# Patient Record
Sex: Female | Born: 1945 | Race: White | Hispanic: No | Marital: Married | Smoking: Former smoker
Health system: Southern US, Community
[De-identification: ages and names within clinical notes are randomized; demographics above are authoritative.]

## PROBLEM LIST (undated history)

## (undated) DIAGNOSIS — F419 Anxiety disorder, unspecified: Secondary | ICD-10-CM

## (undated) DIAGNOSIS — I10 Essential (primary) hypertension: Secondary | ICD-10-CM

## (undated) DIAGNOSIS — K219 Gastro-esophageal reflux disease without esophagitis: Secondary | ICD-10-CM

## (undated) DIAGNOSIS — E039 Hypothyroidism, unspecified: Secondary | ICD-10-CM

## (undated) DIAGNOSIS — I2510 Atherosclerotic heart disease of native coronary artery without angina pectoris: Secondary | ICD-10-CM

## (undated) HISTORY — PX: HERNIA REPAIR: SHX51

## (undated) HISTORY — DX: Hypothyroidism, unspecified: E03.9

---

## 1999-02-04 ENCOUNTER — Emergency Department (HOSPITAL_COMMUNITY): Admission: EM | Admit: 1999-02-04 | Discharge: 1999-02-04 | Payer: Self-pay | Admitting: *Deleted

## 1999-02-06 ENCOUNTER — Emergency Department (HOSPITAL_COMMUNITY): Admission: EM | Admit: 1999-02-06 | Discharge: 1999-02-06 | Payer: Self-pay | Admitting: Emergency Medicine

## 1999-02-18 ENCOUNTER — Emergency Department (HOSPITAL_COMMUNITY): Admission: EM | Admit: 1999-02-18 | Discharge: 1999-02-18 | Payer: Self-pay | Admitting: Emergency Medicine

## 1999-05-09 ENCOUNTER — Other Ambulatory Visit: Admission: RE | Admit: 1999-05-09 | Discharge: 1999-05-09 | Payer: Self-pay | Admitting: *Deleted

## 2001-01-07 ENCOUNTER — Other Ambulatory Visit: Admission: RE | Admit: 2001-01-07 | Discharge: 2001-01-07 | Payer: Self-pay | Admitting: *Deleted

## 2002-06-26 ENCOUNTER — Ambulatory Visit (HOSPITAL_COMMUNITY): Admission: RE | Admit: 2002-06-26 | Discharge: 2002-06-26 | Payer: Self-pay | Admitting: Gastroenterology

## 2002-11-24 ENCOUNTER — Other Ambulatory Visit: Admission: RE | Admit: 2002-11-24 | Discharge: 2002-11-24 | Payer: Self-pay | Admitting: *Deleted

## 2003-04-07 ENCOUNTER — Encounter: Admission: RE | Admit: 2003-04-07 | Discharge: 2003-04-07 | Payer: Self-pay | Admitting: Internal Medicine

## 2003-07-10 ENCOUNTER — Observation Stay (HOSPITAL_COMMUNITY): Admission: RE | Admit: 2003-07-10 | Discharge: 2003-07-11 | Payer: Self-pay | Admitting: Surgery

## 2003-07-10 ENCOUNTER — Encounter (INDEPENDENT_AMBULATORY_CARE_PROVIDER_SITE_OTHER): Payer: Self-pay | Admitting: Specialist

## 2003-09-04 ENCOUNTER — Encounter: Admission: RE | Admit: 2003-09-04 | Discharge: 2003-09-04 | Payer: Self-pay | Admitting: Gastroenterology

## 2003-10-26 ENCOUNTER — Encounter (INDEPENDENT_AMBULATORY_CARE_PROVIDER_SITE_OTHER): Payer: Self-pay | Admitting: Specialist

## 2003-10-27 ENCOUNTER — Inpatient Hospital Stay (HOSPITAL_COMMUNITY): Admission: RE | Admit: 2003-10-27 | Discharge: 2003-10-28 | Payer: Self-pay | Admitting: Surgery

## 2004-07-20 ENCOUNTER — Encounter: Admission: RE | Admit: 2004-07-20 | Discharge: 2004-07-20 | Payer: Self-pay | Admitting: Surgery

## 2006-07-09 ENCOUNTER — Other Ambulatory Visit: Admission: RE | Admit: 2006-07-09 | Discharge: 2006-07-09 | Payer: Self-pay | Admitting: Obstetrics & Gynecology

## 2007-07-24 ENCOUNTER — Other Ambulatory Visit: Admission: RE | Admit: 2007-07-24 | Discharge: 2007-07-24 | Payer: Self-pay | Admitting: Obstetrics and Gynecology

## 2007-08-01 ENCOUNTER — Encounter: Admission: RE | Admit: 2007-08-01 | Discharge: 2007-08-01 | Payer: Self-pay | Admitting: Internal Medicine

## 2007-08-09 ENCOUNTER — Encounter: Admission: RE | Admit: 2007-08-09 | Discharge: 2007-08-09 | Payer: Self-pay | Admitting: Gastroenterology

## 2010-03-20 ENCOUNTER — Encounter: Payer: Self-pay | Admitting: Gastroenterology

## 2010-07-15 NOTE — Op Note (Signed)
NAME:  MONCERRATH, BERHE                           ACCOUNT NO.:  [id number]   MEDICAL RECORD NO.:  [id number]                   PATIENT TYPE:  OBV   LOCATION:  0098                                 FACILITY:  [facility]   PHYSICIAN:  Sandria Bales. Ezzard Standing, M.D.               DATE OF BIRTH:  December 24, 1945   DATE OF PROCEDURE:  10/26/2003  DATE OF DISCHARGE:                                 OPERATIVE REPORT   PREOPERATIVE DIAGNOSIS:  Medium-sized paraesophageal hiatal hernia.   POSTOPERATIVE DIAGNOSIS:  Medium-sized paraesophageal hiatal hernia.   PROCEDURE:  Repair of hiatal hernia with Nissen fundoplication.   SURGEON:  Sandria Bales. Ezzard Standing, M.D.   FIRST ASSISTANT:  Thornton Park. Daphine Deutscher, M.D.   ANESTHESIA:  General endotracheal.   ESTIMATED BLOOD LOSS:  Minimal.   INDICATION FOR PROCEDURE:  Ms. Faraj is a 65 year old white female who has  had recurrent epigastric abdominal pain with eating, and upper GIs revealed  a medium to large-sized paraesophageal hiatal hernia.  Discussion was  carried out with the patient about the options for taking care of this.  She  has tried proton pump inhibitors without success and now comes for attempted  laparoscopic repair of this hernia.   The patient placed in a supine position with her arms tucked to her sides.  Her abdomen was prepped with Betadine solution and sterilely draped.  She  was given 1 g of Ancef at the initiation of the procedure.  She had PAS  stockings in place.  She was then placed in a reverse Trendelenburg position  and her abdomen was draped and sterilely dressed.   I got into the abdomen using an Optiview through a left subcostal incision.  I placed four additional trocars, a left paramedian and a right paramedian  trocar, a right subcostal trocar, and then a 5 mm subxiphoid trocar.   Abdominal exploration revealed the right and left lobes of the liver were  unremarkable.  The bowel was otherwise unremarkable.  The gallbladder was  absent.  I  did then reduce the stomach out of the hiatal hernia.  Maybe  about a third to half the stomach was within the hiatal hernia.  The hernia  was based primarily anterior and to the left of the gastroesophageal  junction.   I made an incision anteriorly along the edge of the hernia and then carried  this down to the gastroesophageal junction on the left side.  I went behind  the esophagus and identified the right crus posteriorly to where it joins  the left crus, then went up the left crus behind the esophagus.  I then went  down and took down the short gastrics, taking down maybe 8-10 cm of the  stomach to try to make where I had a floppy cardia of the stomach.   At this point I stripped out the remainder of the hernia sac.  I divided the  sac and sent it to pathology.  I then closed the hernia posteriorly.  I  placed three interrupted 0 Surgilon sutures, closing the crus posteriorly.  I placed a single along the anterior.  This left the esophagus coming  through the GE junction with a little bit of extra room.   I then passed the stomach behind the esophagus.  I passed a lighted 56  bougie and did my wrap over the 56 bougie.  My first stitch caught both the  stomach to the left, the esophagus anteriorly, and the stomach to the right  side and tied this down.  My next sutures each at about 1.5 cm in spacing.  I did not try to catch any of the esophagus or the stomach.  Really there  was just a lot of fat and preperitoneal kind of hernia sac tissue.   After the three sutures I then measured the wrap.  It was about 3.5 cm long.  I used Surgidac on each of the sutures with a wrap of 0 Surgidac.  I marked  each with a clip for future reference.  I then withdrew the lighted bougie.  This left a floppy Nissen.  The hernia hole itself had been repaired and  closed with what I thought was adequate room for the esophagus.  There was  no evidence of any injury to the esophagus or the stomach.    The trocars were then removed in turn.  I had used a Surveyor, mining for  the liver, and this was removed.  The upper abdomen was then evaluated and  again the liver again looked okay after it had been returned to a normal  location.  Each trocar was removed in turn.  The skin at each trocar site  was closed with a 5-0 Vicryl suture, painted with tincture of Benzoin and  steri-stripped.   The patient tolerated the procedure well.  Again, stripping out and  identifying the hernia sac, stripping the hernia sac out, closing the hernia  defect at the crus, and the wrap all went according to plan with no surprise  or difficulties.                                               Sandria Bales. Ezzard Standing, M.D.    DHN/MEDQ  D:  10/26/2003  T:  10/27/2003  Job:  [id number]   cc:   Theressa Millard, M.D.  [address] [place]  [place] [address]  Fax: [phone]   Tasia Catchings, M.D.  [address]  [place]  [place] [address]  Fax: [phone]

## 2010-07-15 NOTE — Op Note (Signed)
NAME:  Meagan Stout, Meagan Stout                           ACCOUNT NO.:  [id number]   MEDICAL RECORD NO.:  [id number]                   PATIENT TYPE:  AMB   LOCATION:  DAY                                  FACILITY:  [facility]   PHYSICIAN:  Sandria Bales. Ezzard Standing, M.D.               DATE OF BIRTH:  05-May-1945   DATE OF PROCEDURE:  07/10/2003  DATE OF DISCHARGE:                                 OPERATIVE REPORT   PREOPERATIVE DIAGNOSES:  Chronic cholecystitis with cholelithiasis.   POSTOPERATIVE DIAGNOSES:  Chronic cholecystitis with cholelithiasis.   PROCEDURE:  Laparoscopic cholecystectomy (I did not do a cholangiogram).   SURGEON:  Sandria Bales. Ezzard Standing, M.D.   FIRST ASSISTANT:  Lebron Conners, M.D.   ANESTHESIA:  General endotracheal.   ESTIMATED BLOOD LOSS:  Minimal.   INDICATIONS FOR PROCEDURE:  Ms. Meagan Stout is a 65 year old white female who has  had vague GI symptoms for some time. She underwent an ultrasound on the 8th  of February 2005, which showed a ___________ gallbladder which was felt to  be consistent with gallbladder contraction and probable shattering of  gallstones.   The patient comes to the operating room for attempted laparoscopic  cholecystectomy. The indications and potential complications were explained  to the patient. The potential complications included but not limited to  bleeding, infection, bile duct injury, open surgery.   The patient was placed in the supine position, given 1 gram of Ancef at the  initiation of the procedure.  PAS stockings were placed, she underwent a  general anesthesia supervised by Dr. Lucious Groves. She has kind of a smiling  infraumbilical incision and I went through it to get to the abdominal  cavity.  I used a 12 mm Hasson trocar to get into the abdominal cavity.   This was secured with a #0 Vicryl suture and I used a zero degree 10 mm  laparoscope. The right and left lobes of the liver were unremarkable, the  anterior wall of the stomach was  unremarkable. The remainder of her bowel  was pretty much covered with omentum. Four additional ports were placed, a  10 mm Ethicon trocar in the subxiphoid location, a 5 mm Ethicon trocar in  the right mid subcostal and a 5 mm Ethicon trocar in the right lateral  location. Getting up under the liver, the gallbladder was noted to be  extremely tiny. This gallbladder was probably not much more then 2 cm in  length and I had to put a fifth trocar which was a 5 mm trocar sort of  midway between the umbilicus and the xiphoid to kind of have a second hand  to work with while Dr. Orson Slick who is my assistant could hold up the liver  and expose the liver and gallbladder for me.   I first dissected out the cystic duct gallbladder junction. I tried to shoot  a cholangiogram but she had a  tiny cystic duct.  I could use it to cut off a  taut catheter inserted through a 14 gauge Jelco with multiple attempts at  trying to get the catheter into it were unsuccessful. I then recut the  cystic duct a little bit further down and again she had such a tiny cystic  duct, I really was never able to cannulate it so I did not try to do a  cholangiogram but I did think I had clear anatomy where the cystic duct went  to the gallbladder there was no other obvious side duct or abnormality. She  did have some gravel float out of the cystic duct and her gallbladder seemed  to be packed full of stones.   So I then placed three clips across the cystic duct. I clipped the cystic  artery twice and then dissected out the gallbladder using hook Bovie  coagulation.   The gallbladder was completely divided from the gallbladder bed, I  reexamined the gallbladder bed which was really up under the liver and deep  in the triangle of Calot and this was unremarkable for any bleeding or bile  leak. I then delivered the gallbladder through the umbilicus intact again  this gallbladder was probably 2 cm in length at the most and did  have a  bunch of stains in it.   I then irrigated the abdomen with about 500 mL of saline, removed the trocar  and there was no bleeding in the trocar site. The umbilical port was closed  with a #0 Vicryl suture, the skin at each site was closed with 5-0 Vicryl  suture, painted with tincture of Benzoin and Steri-Strips.  The patient  tolerated the procedure well and was transported to the recovery room in  good condition.                                               Sandria Bales. Ezzard Standing, M.D.    DHN/MEDQ  D:  07/10/2003  T:  07/10/2003  Job:  [id number]

## 2016-02-07 ENCOUNTER — Other Ambulatory Visit: Payer: Self-pay | Admitting: Internal Medicine

## 2016-02-07 ENCOUNTER — Other Ambulatory Visit (HOSPITAL_COMMUNITY)
Admission: RE | Admit: 2016-02-07 | Discharge: 2016-02-07 | Disposition: A | Discharge status: Home / Self Care | Payer: Medicare Other | Source: Ambulatory Visit | Attending: Internal Medicine | Admitting: Internal Medicine

## 2016-02-07 DIAGNOSIS — Z1151 Encounter for screening for human papillomavirus (HPV): Secondary | ICD-10-CM | POA: Insufficient documentation

## 2016-02-07 DIAGNOSIS — Z01419 Encounter for gynecological examination (general) (routine) without abnormal findings: Secondary | ICD-10-CM | POA: Diagnosis present

## 2016-02-10 LAB — CYTOLOGY - PAP
Diagnosis: NEGATIVE
HPV: NOT DETECTED

## 2017-08-27 ENCOUNTER — Other Ambulatory Visit: Payer: Self-pay | Admitting: Orthopedic Surgery

## 2017-08-27 DIAGNOSIS — M25562 Pain in left knee: Secondary | ICD-10-CM

## 2017-08-29 ENCOUNTER — Ambulatory Visit
Admission: RE | Admit: 2017-08-29 | Discharge: 2017-08-29 | Disposition: A | Discharge status: Home / Self Care | Payer: Medicare Other | Source: Ambulatory Visit | Attending: Orthopedic Surgery | Admitting: Orthopedic Surgery

## 2017-08-29 DIAGNOSIS — M25562 Pain in left knee: Secondary | ICD-10-CM

## 2017-09-07 DIAGNOSIS — M2392 Unspecified internal derangement of left knee: Secondary | ICD-10-CM | POA: Insufficient documentation

## 2018-04-11 ENCOUNTER — Inpatient Hospital Stay (HOSPITAL_COMMUNITY)
Admission: EM | Admit: 2018-04-11 | Discharge: 2018-04-12 | DRG: 286 | Disposition: A | Discharge status: Home / Self Care | Payer: Medicare Other | Attending: Internal Medicine | Admitting: Internal Medicine

## 2018-04-11 ENCOUNTER — Encounter: Payer: Self-pay | Admitting: Emergency Medicine

## 2018-04-11 ENCOUNTER — Other Ambulatory Visit: Payer: Self-pay

## 2018-04-11 ENCOUNTER — Emergency Department (HOSPITAL_COMMUNITY): Payer: Medicare Other

## 2018-04-11 DIAGNOSIS — K219 Gastro-esophageal reflux disease without esophagitis: Secondary | ICD-10-CM | POA: Diagnosis present

## 2018-04-11 DIAGNOSIS — I5041 Acute combined systolic (congestive) and diastolic (congestive) heart failure: Secondary | ICD-10-CM | POA: Diagnosis not present

## 2018-04-11 DIAGNOSIS — I252 Old myocardial infarction: Secondary | ICD-10-CM | POA: Diagnosis not present

## 2018-04-11 DIAGNOSIS — I248 Other forms of acute ischemic heart disease: Secondary | ICD-10-CM | POA: Diagnosis not present

## 2018-04-11 DIAGNOSIS — I214 Non-ST elevation (NSTEMI) myocardial infarction: Secondary | ICD-10-CM | POA: Diagnosis present

## 2018-04-11 DIAGNOSIS — I2510 Atherosclerotic heart disease of native coronary artery without angina pectoris: Principal | ICD-10-CM | POA: Diagnosis present

## 2018-04-11 DIAGNOSIS — Z8249 Family history of ischemic heart disease and other diseases of the circulatory system: Secondary | ICD-10-CM

## 2018-04-11 DIAGNOSIS — Z87891 Personal history of nicotine dependence: Secondary | ICD-10-CM | POA: Diagnosis not present

## 2018-04-11 DIAGNOSIS — R079 Chest pain, unspecified: Secondary | ICD-10-CM | POA: Diagnosis not present

## 2018-04-11 DIAGNOSIS — I959 Hypotension, unspecified: Secondary | ICD-10-CM | POA: Diagnosis not present

## 2018-04-11 DIAGNOSIS — R7989 Other specified abnormal findings of blood chemistry: Secondary | ICD-10-CM

## 2018-04-11 DIAGNOSIS — I5031 Acute diastolic (congestive) heart failure: Secondary | ICD-10-CM | POA: Diagnosis present

## 2018-04-11 DIAGNOSIS — R778 Other specified abnormalities of plasma proteins: Secondary | ICD-10-CM

## 2018-04-11 DIAGNOSIS — F419 Anxiety disorder, unspecified: Secondary | ICD-10-CM | POA: Diagnosis not present

## 2018-04-11 DIAGNOSIS — I5042 Chronic combined systolic (congestive) and diastolic (congestive) heart failure: Secondary | ICD-10-CM | POA: Diagnosis present

## 2018-04-11 HISTORY — DX: Gastro-esophageal reflux disease without esophagitis: K21.9

## 2018-04-11 HISTORY — DX: Atherosclerotic heart disease of native coronary artery without angina pectoris: I25.10

## 2018-04-11 HISTORY — DX: Anxiety disorder, unspecified: F41.9

## 2018-04-11 LAB — BASIC METABOLIC PANEL
Anion gap: 6 (ref 5–15)
BUN: 10 mg/dL (ref 8–23)
CO2: 26 mmol/L (ref 22–32)
Calcium: 8.8 mg/dL — ABNORMAL LOW (ref 8.9–10.3)
Chloride: 109 mmol/L (ref 98–111)
Creatinine, Ser: 0.95 mg/dL (ref 0.44–1.00)
GFR calc Af Amer: >60 mL/min (ref >60)
GFR calc non Af Amer: 60 mL/min — ABNORMAL LOW (ref >60)
Glucose, Bld: 96 mg/dL (ref 70–99)
Potassium: 4 mmol/L (ref 3.5–5.1)
Sodium: 141 mmol/L (ref 135–145)

## 2018-04-11 LAB — CBC WITH DIFFERENTIAL/PLATELET
Abs Immature Granulocytes: 0.03 10*3/uL (ref 0.00–0.07)
Basophils Absolute: 0.1 10*3/uL (ref 0.0–0.1)
Basophils Relative: 1 %
Eosinophils Absolute: 0.1 10*3/uL (ref 0.0–0.5)
Eosinophils Relative: 1 %
HCT: 41.4 % (ref 36.0–46.0)
Hemoglobin: 13 g/dL (ref 12.0–15.0)
Immature Granulocytes: 0 %
Lymphocytes Relative: 21 %
Lymphs Abs: 2 10*3/uL (ref 0.7–4.0)
MCH: 26.9 pg (ref 26.0–34.0)
MCHC: 31.4 g/dL (ref 30.0–36.0)
MCV: 85.5 fL (ref 80.0–100.0)
Monocytes Absolute: 0.9 10*3/uL (ref 0.1–1.0)
Monocytes Relative: 9 %
Neutro Abs: 6.5 10*3/uL (ref 1.7–7.7)
Neutrophils Relative %: 68 %
Platelets: 280 10*3/uL (ref 150–400)
RBC: 4.84 MIL/uL (ref 3.87–5.11)
RDW: 13.3 % (ref 11.5–15.5)
WBC: 9.6 10*3/uL (ref 4.0–10.5)
nRBC: 0 % (ref 0.0–0.2)

## 2018-04-11 LAB — HEMOGLOBIN A1C
Hgb A1c MFr Bld: 6.1 % — ABNORMAL HIGH (ref 4.8–5.6)
Mean Plasma Glucose: 128.37 mg/dL

## 2018-04-11 LAB — BRAIN NATRIURETIC PEPTIDE: B Natriuretic Peptide: 740.5 pg/mL — ABNORMAL HIGH (ref 0.0–100.0)

## 2018-04-11 LAB — I-STAT TROPONIN, ED: Troponin i, poc: 0.51 ng/mL (ref 0.00–0.08)

## 2018-04-11 LAB — D-DIMER, QUANTITATIVE: D-Dimer, Quant: 0.37 ug/mL-FEU (ref 0.00–0.50)

## 2018-04-11 LAB — HEPARIN LEVEL (UNFRACTIONATED): Heparin Unfractionated: 0.19 IU/mL — ABNORMAL LOW (ref 0.30–0.70)

## 2018-04-11 LAB — TROPONIN I: Troponin I: 0.58 ng/mL (ref <0.03)

## 2018-04-11 MED ORDER — SODIUM CHLORIDE 0.9 % WEIGHT BASED INFUSION
3.0000 mL/kg/h | INTRAVENOUS | Status: DC
  Administered 2018-04-12: 3 mL/kg/h via INTRAVENOUS

## 2018-04-11 MED ORDER — ASPIRIN EC 81 MG PO TBEC: 81.0000 mg | DELAYED_RELEASE_TABLET | Freq: Every day | ORAL | Status: DC

## 2018-04-11 MED ORDER — ASPIRIN 81 MG PO CHEW
81.0000 mg | CHEWABLE_TABLET | ORAL | Status: AC
  Administered 2018-04-12: 81 mg via ORAL
  Filled 2018-04-11: qty 1

## 2018-04-11 MED ORDER — PAROXETINE HCL 20 MG PO TABS
20.0000 mg | ORAL_TABLET | Freq: Every day | ORAL | Status: DC
  Administered 2018-04-11: 20 mg via ORAL
  Filled 2018-04-11: qty 1

## 2018-04-11 MED ORDER — ONDANSETRON HCL 4 MG/2ML IJ SOLN: 4.0000 mg | Freq: Four times a day (QID) | INTRAMUSCULAR | Status: DC | PRN

## 2018-04-11 MED ORDER — CALCIUM CARBONATE ANTACID 500 MG PO CHEW: 1.0000 | CHEWABLE_TABLET | ORAL | Status: DC | PRN

## 2018-04-11 MED ORDER — LOPERAMIDE HCL 2 MG PO CAPS: 2.0000 mg | ORAL_CAPSULE | Freq: Four times a day (QID) | ORAL | Status: DC | PRN

## 2018-04-11 MED ORDER — ATORVASTATIN CALCIUM 80 MG PO TABS
80.0000 mg | ORAL_TABLET | Freq: Every day | ORAL | Status: DC
  Administered 2018-04-11: 80 mg via ORAL
  Filled 2018-04-11: qty 1

## 2018-04-11 MED ORDER — SODIUM CHLORIDE 0.9% FLUSH: 3.0000 mL | INTRAVENOUS | Status: DC | PRN

## 2018-04-11 MED ORDER — GLYCOPYRROLATE 1 MG PO TABS
1.0000 mg | ORAL_TABLET | Freq: Every day | ORAL | Status: DC | PRN
  Filled 2018-04-11: qty 1

## 2018-04-11 MED ORDER — METOPROLOL TARTRATE 12.5 MG HALF TABLET
12.5000 mg | ORAL_TABLET | Freq: Two times a day (BID) | ORAL | Status: DC
  Administered 2018-04-11: 12.5 mg via ORAL
  Filled 2018-04-11 (×2): qty 1

## 2018-04-11 MED ORDER — ASPIRIN 81 MG PO CHEW
324.0000 mg | CHEWABLE_TABLET | Freq: Once | ORAL | Status: DC
  Filled 2018-04-11: qty 4

## 2018-04-11 MED ORDER — HEPARIN BOLUS VIA INFUSION
4000.0000 [IU] | Freq: Once | INTRAVENOUS | Status: AC
  Administered 2018-04-11: 4000 [IU] via INTRAVENOUS
  Filled 2018-04-11: qty 4000

## 2018-04-11 MED ORDER — LOPERAMIDE HCL 2 MG PO TABS: 2.0000 mg | ORAL_TABLET | Freq: Four times a day (QID) | ORAL | Status: DC | PRN

## 2018-04-11 MED ORDER — NITROGLYCERIN 0.4 MG SL SUBL: 0.4000 mg | SUBLINGUAL_TABLET | SUBLINGUAL | Status: DC | PRN

## 2018-04-11 MED ORDER — ACETAMINOPHEN 325 MG PO TABS: 650.0000 mg | ORAL_TABLET | ORAL | Status: DC | PRN

## 2018-04-11 MED ORDER — PANTOPRAZOLE SODIUM 40 MG PO TBEC
40.0000 mg | DELAYED_RELEASE_TABLET | Freq: Every day | ORAL | Status: DC
  Administered 2018-04-11: 40 mg via ORAL
  Filled 2018-04-11: qty 1

## 2018-04-11 MED ORDER — HEPARIN (PORCINE) 25000 UT/250ML-% IV SOLN
1300.0000 [IU]/h | INTRAVENOUS | Status: DC
  Administered 2018-04-11: 1050 [IU]/h via INTRAVENOUS
  Filled 2018-04-11 (×3): qty 250

## 2018-04-11 MED ORDER — SODIUM CHLORIDE 0.9 % IV SOLN: 250.0000 mL | INTRAVENOUS | Status: DC | PRN

## 2018-04-11 MED ORDER — SODIUM CHLORIDE 0.9 % WEIGHT BASED INFUSION
1.0000 mL/kg/h | INTRAVENOUS | Status: DC
  Administered 2018-04-12: 1 mL/kg/h via INTRAVENOUS

## 2018-04-11 MED ORDER — SODIUM CHLORIDE 0.9% FLUSH
3.0000 mL | Freq: Two times a day (BID) | INTRAVENOUS | Status: DC
  Administered 2018-04-11 – 2018-04-12 (×2): 3 mL via INTRAVENOUS

## 2018-04-11 NOTE — ED Provider Notes (Signed)
[facility] Provider Note   CSN: [id number] Arrival date & time: 04/11/18  1510     History   Chief Complaint Chief Complaint  Patient presents with  . Chest Pain  . Dizziness    HPI Meagan BisonJane E Stout is a 73 y.o. female.  The history is provided by the patient and medical records. No language interpreter was used.  Chest Pain  Associated symptoms: dizziness   Dizziness  Associated symptoms: chest pain      Generally healthy 73 year old female brought here via EMS from PCP office for evaluation of shortness of breath.  Patient reports yesterday she reported having some sensation of indigestion in her mid chest.  States last for several hours she attributed to having her usual occasion.  She went to the pharmacy to have it refill, and took medication without any relief.  Today while she was at a luncheon, she felt weak, lightheaded, nauseous, shortness of breath, felt like a panic attack.  She went to her PCP office provider noticed changes in her EKG and EMS was contacted to bring her here.  Currently her sxs has since resolved.  Patient denies any significant cardiac history.  She denies any prior history of PE.  She does endorse generalized fatigue cough for the past several months which she attributes to activities.  She denies any fever chills no productive cough no bowel bladder changes, no focal numbness or weakness.  Past Medical History:  Diagnosis Date  . Anxiety   . GERD (gastroesophageal reflux disease)     There are no active problems to display for this patient.   Past Surgical History:  Procedure Laterality Date  . HERNIA REPAIR       OB History   No obstetric history on file.      Home Medications    Prior to Admission medications   Not on File    Family History No family history on file.  Social History Social History   Tobacco Use  . Smoking status: Not on file  Substance Use Topics  . Alcohol use: Not  on file  . Drug use: Not on file     Allergies   Patient has no allergy information on record.   Review of Systems Review of Systems  Cardiovascular: Positive for chest pain.  Neurological: Positive for dizziness.  All other systems reviewed and are negative.    Physical Exam Updated Vital Signs BP 132/90   Pulse 93   Temp 98.8 F (37.1 C) (Oral)   Resp 18   Ht 5\' 9"  (1.753 m)   Wt 88 kg   SpO2 98%   BMI 28.65 kg/m   Physical Exam Vitals signs and nursing note reviewed.  Constitutional:      General: She is not in acute distress.    Appearance: She is well-developed.  HENT:     Head: Atraumatic.  Eyes:     Conjunctiva/sclera: Conjunctivae normal.  Neck:     Musculoskeletal: Neck supple.  Cardiovascular:     Rate and Rhythm: Normal rate and regular rhythm.     Pulses: Normal pulses.     Heart sounds: Normal heart sounds.  Pulmonary:     Effort: Pulmonary effort is normal.     Breath sounds: Normal breath sounds. No wheezing.  Abdominal:     General: Abdomen is flat.     Palpations: Abdomen is soft.     Tenderness: There is no abdominal tenderness.  Musculoskeletal:  General: No swelling or tenderness.  Skin:    Findings: No rash.  Neurological:     Mental Status: She is alert and oriented to person, place, and time.  Psychiatric:        Mood and Affect: Mood normal.      ED Treatments / Results  Labs (all labs ordered are listed, but only abnormal results are displayed) Labs Reviewed  BASIC METABOLIC PANEL - Abnormal; Notable for the following components:      Result Value   Calcium 8.8 (*)    GFR calc non Af Amer 60 (*)    All other components within normal limits  BRAIN NATRIURETIC PEPTIDE - Abnormal; Notable for the following components:   B Natriuretic Peptide 740.5 (*)    All other components within normal limits  I-STAT TROPONIN, ED - Abnormal; Notable for the following components:   Troponin i, poc 0.51 (*)    All other  components within normal limits  CBC WITH DIFFERENTIAL/PLATELET  D-DIMER, QUANTITATIVE (NOT AT [facility])  HEPARIN LEVEL (UNFRACTIONATED)  CBC  HEPARIN LEVEL (UNFRACTIONATED)  TROPONIN I  TROPONIN I    EKG EKG Interpretation  Date/Time:  Thursday April 11 2018 15:11:14 EST Ventricular Rate:  92 PR Interval:  168 QRS Duration: 100 QT Interval:  402 QTC Calculation: 497 R Axis:   -23 Text Interpretation:  Normal sinus rhythm Incomplete right bundle branch block Minimal voltage criteria for LVH, may be normal variant ST elevation, consider early repolarization, pericarditis, or injury T wave abnormality, consider anterolateral ischemia Prolonged QT Abnormal ECG Concern for ST changes in precordial leads, repeat immediately ordered Confirmed by Shaune PollackIsaacs, Cameron [phone]([id number]) on 04/11/2018 4:05:27 PM   EKG Interpretation  Date/Time:  Thursday April 11 2018 15:11:14 EST Ventricular Rate:  92 PR Interval:  168 QRS Duration: 100 QT Interval:  402 QTC Calculation: 497 R Axis:   -23 Text Interpretation:  Normal sinus rhythm Incomplete right bundle branch block Minimal voltage criteria for LVH, may be normal variant ST elevation, consider early repolarization, pericarditis, or injury T wave abnormality, consider anterolateral ischemia Prolonged QT Abnormal ECG Concern for ST changes in precordial leads, repeat immediately ordered Confirmed by Shaune PollackIsaacs, Cameron [phone]([id number]) on 04/11/2018 4:05:27 PM        Radiology Dg Chest Portable 1 View  Result Date: 04/11/2018 CLINICAL DATA:  Chest pain and shortness of breath for 2 days. EXAM: PORTABLE CHEST 1 VIEW COMPARISON:  07/09/2003 FINDINGS: The cardiac silhouette, mediastinal and hilar contours are within normal limits and stable. There is mild tortuosity and calcification of the thoracic aorta. The lungs demonstrate mild hyperinflation and probable mild emphysematous changes. Bibasilar streaky scarring type changes but no infiltrates or effusions. No  worrisome pulmonary lesions. There is a moderate-sized hiatal hernia. The bony thorax is intact. IMPRESSION: No acute cardiopulmonary findings. Electronically Signed   By: Rudie MeyerP.  Gallerani M.D.   On: 04/11/2018 17:18    Procedures .Critical Care Performed by: Fayrene Helperran, Tyrie Porzio, PA-C Authorized by: Fayrene Helperran, Misha Vanoverbeke, PA-C   Critical care provider statement:    Critical care time (minutes):  45   Critical care was time spent personally by me on the following activities:  Discussions with consultants, evaluation of patient's response to treatment, examination of patient, ordering and performing treatments and interventions, ordering and review of laboratory studies, ordering and review of radiographic studies, pulse oximetry, re-evaluation of patient's condition, obtaining history from patient or surrogate and review of old charts   (including critical care time)  Medications Ordered in ED  Medications  heparin ADULT infusion 100 units/mL (25000 units/239mL sodium chloride 0.45%) (1,050 Units/hr Intravenous New Bag/Given 04/11/18 1655)  aspirin chewable tablet 324 mg (0 mg Oral Hold 04/11/18 1658)  heparin bolus via infusion 4,000 Units (4,000 Units Intravenous Bolus from Bag 04/11/18 1653)     Initial Impression / Assessment and Plan / ED Course  I have reviewed the triage vital signs and the nursing notes.  Pertinent labs & imaging results that were available during my care of the patient were reviewed by me and considered in my medical decision making (see chart for details).     BP 132/90   Pulse 93   Temp 98.8 F (37.1 C) (Oral)   Resp 18   Ht 5\' 9"  (1.753 m)   Wt 88 kg   SpO2 98%   BMI 28.65 kg/m    Final Clinical Impressions(s) / ED Diagnoses   Final diagnoses:  NSTEMI (non-ST elevated myocardial infarction) [facility])    ED Discharge Orders    None     3:26 PM Patient here with complaints of indigestion, chest discomfort from yesterday, and shortness of breath anxiety today.  Initial  EKG showing changes to anterior leads which is new.  Trop elevated at 0.51.  No active CP currently.  Pharmacy will dose heparin, cardiology consulted and will see pt.  Care discussed with Dr. Erma Heritage.   4:57 PM Cardiology has seen and evaluated patient.  They would like to obtain a d-dimer to rule out PE.  If negative, plan to catheterize patient tomorrow.  Patient will receive 324 mg of aspirin.  Patient will be admitted to cardiology for further care.  She will be heparinized.  She is currently hemodynamically stable.   Fayrene Helper, PA-C 04/11/18 1746    Shaune Pollack, MD 04/12/18 1306

## 2018-04-11 NOTE — Progress Notes (Signed)
ANTICOAGULATION CONSULT NOTE - Initial Consult  Pharmacy Consult for heparin Indication: chest pain/ACS  No Known Allergies  Patient Measurements: Height: 5\' 9"  (175.3 cm) Weight: 194 lb (88 kg) IBW/kg (Calculated) : 66.2 Heparin Dosing Weight: 84.3 kg  Vital Signs: Temp: 98.8 F (37.1 C) (02/13 1514) Temp Source: Oral (02/13 1514) BP: 122/79 (02/13 1514) Pulse Rate: 90 (02/13 1514)  Labs: Recent Labs    04/11/18 1539  HGB 13.0  HCT 41.4  PLT 280    CrCl cannot be calculated (No successful lab value found.).   Medical History: Past Medical History:  Diagnosis Date  . Anxiety   . GERD (gastroesophageal reflux disease)    Assessment: 73 yo F presents with chest heaviness. CBC stable. Troponin elevated at 0.51.  Goal of Therapy:  Heparin level 0.3-0.7 units/ml Monitor platelets by anticoagulation protocol: Yes   Plan:  Give heparin 4,000 unit bolus Start heparin gtt at 1,050 units/hr Monitor daily heparin level, CBC, s/s of bleed  Meagan Stout J 04/11/2018,4:13 PM

## 2018-04-11 NOTE — H&P (Addendum)
Cardiology Admission History and Physical:   Patient ID: Meagan Stout MRN: [id number]; DOB: 06-Jan-1946   Admission date: 04/11/2018  Primary Care Provider: Lorenda Ishihara, MD Primary Cardiologist: Previously seen by Dr. Katrinka Blazing. Primary Electrophysiologist:  None   Chief Complaint:  Chest Pain  Patient Profile:   Meagan Stout is a 73 y.o. female with a history GERD, hiatal hernia s/p repair in 2005, anxiety, and remote tobacco use but no known cardiac history, who presents to the [facility] ED for evaluation of chest pain.  History of Present Illness:   Meagan Stout with a history of GERD, hiatal hernia s/p repair, anxiety, and remote tobacco use but no known cardiac history. Patient states she had a cardiac catheterization in her late teens/early 20's to assess an irregular heart rate after having a severe case of mono. Patient reports the catheterization was normal. Patient saw Dr. Katrinka Blazing and had stress test done about 20 years ago for shortness of breath. Stress test was normal and symptoms were felt to be due to anxiety. Patient was started on Paxil and symptoms resolved.   Patient presented to the [facility] ED for evaluation of chest pain. Patient reports onset of substernal chest pain yesterday after eating lunch. Patient describes the pain as a "heavy ache" and states it would not let up. Patient initially thought it was just reflux so she took Protonix but that did not help. As the day went on, the pain started to radiated up to her neck and to her back. This was similar to previous pain with GERD and hiatal hernia so patient did not think too much about it. Pain is worse when she takes a deep breath. Patient and husband drove to [place] a few weeks ago. Patient denies any other recent travel or surgery. She has never had a DVT/PE before. Pain persisted throughout the night and woke her up throughout the night. Around 5:30am, she woke up with aching in her jaw and some nausea but  she was able to go back to sleep. When she woke up later, pain was completely gone and she felt fine. She went to work as usual but later in the day she started to have some weakness in her legs as well as some lightheadedness, shortness of breath, dizziness, and palpitations. She went to see her PCP who then sent the patient to the ED due to irregular EKG and hypotension with systolic BP in the 80's.   Patient denies any exertional chest pain although she has not been very active over the last year due to multiple torn ligaments in her left knee. She doees report some shortness of breath for "quite some time" where she feels "air hungry." She thinks this shortness of breath is getting a little worse. She denies any orthopnea or edema. She denies any recent fevers or illnesses.   Upon arrival to the ED, vitals stable with BP of 122/79. EKG showed normal sinus rhythm with LVH and  diffuse T wave inversion in multiple (no prior tracings available for comparison). I-stat troponin elevated at 0.51. BNP elevated at 740.5. CBC unremarkable. Na 141, K 4.0, Glucose 96, SCr 0.95. At the time, of this evaluation patient is chest pain free.   Patient is a former smoker but quit about 45 years ago. She reports occasional alcohol use (1-2 times per week) but denies any recreational drug use. Patient has a family history of heart disease with her father having a heart attack at the age of  54.   Past Medical History:  Diagnosis Date  . Anxiety   . GERD (gastroesophageal reflux disease)     Past Surgical History:  Procedure Laterality Date  . HERNIA REPAIR       Medications Prior to Admission: Prior to Admission medications   Not on File     Allergies:   No Known Allergies  Social History:   Social History   Socioeconomic History  . Marital status: Married    Spouse name: Not on file  . Number of children: Not on file  . Years of education: Not on file  . Highest education level: Not on file    Occupational History  . Not on file  Social Needs  . Financial resource strain: Not on file  . Food insecurity:    Worry: Not on file    Inability: Not on file  . Transportation needs:    Medical: Not on file    Non-medical: Not on file  Tobacco Use  . Smoking status: Never Smoker  . Smokeless tobacco: Never Used  Substance and Sexual Activity  . Alcohol use: Yes    Comment: occ  . Drug use: Not on file  . Sexual activity: Not on file  Lifestyle  . Physical activity:    Days per week: Not on file    Minutes per session: Not on file  . Stress: Not on file  Relationships  . Social connections:    Talks on phone: Not on file    Gets together: Not on file    Attends religious service: Not on file    Active member of club or organization: Not on file    Attends meetings of clubs or organizations: Not on file    Relationship status: Not on file  . Intimate partner violence:    Fear of current or ex partner: Not on file    Emotionally abused: Not on file    Physically abused: Not on file    Forced sexual activity: Not on file  Other Topics Concern  . Not on file  Social History Narrative  . Not on file    Family History:   The patient's family history includes CAD in her father.    ROS:  Please see the history of present illness.   Review of Systems  Constitutional: Positive for malaise/fatigue. Negative for chills, fever and weight loss.  HENT: Positive for congestion. Negative for sore throat.   Respiratory: Positive for hemoptysis, shortness of breath and wheezing.   Cardiovascular: Positive for chest pain. Negative for orthopnea, leg swelling and PND.  Gastrointestinal: Positive for nausea. Negative for blood in stool and vomiting.  Genitourinary: Negative for hematuria.  Musculoskeletal: Negative for myalgias.  Neurological: Positive for dizziness and weakness. Negative for loss of consciousness.  Endo/Heme/Allergies: Positive for environmental allergies. Does  not bruise/bleed easily.  Psychiatric/Behavioral: Negative for substance abuse.     Physical Exam/Data:   Vitals:   04/11/18 1514 04/11/18 1620 04/11/18 1645 04/11/18 1715  BP: 122/79  132/90 134/74  Pulse: 90 84 93 88  Resp: 18 15 18 18   Temp: 98.8 F (37.1 C)     TempSrc: Oral     SpO2: 98% 96% 98% 96%  Weight:      Height:       No intake or output data in the 24 hours ending 04/11/18 1800 Last 3 Weights 04/11/2018  Weight (lbs) 194 lb  Weight (kg) 87.998 kg     Body mass  index is 28.65 kg/m.  General:  Well nourished, well developed, in no acute distress. Pleasant and cooperative. HEENT: Head normocephalic and atraumatic. Sclera clear. EOMs intact.  Neck: Supple. No JVD. Vascular: Radial and distal pedal pulses 2+ and equal bilaterally.  Cardiac: RRR. Distinct S1 and S2. No murmurs, gallops, or rubs.  Lungs: No increased work of breathing. Clear to auscultation bilaterally. No wheezing, rhonchi or rales. Abd: Soft, non-distended, and non-tender. Bowel sounds present. Ext: No lower extremity edema. Musculoskeletal:  No deformities. BUE and BLE strength normal and equal for age. Skin: Warm and dry  Neuro:  Alert and oriented x3. No focal abnormalities noted. Psych:  Normal affect. Responds appropriately.   EKG:  The ECG that was done  was personally reviewed and demonstrates normal sinus rhythm with LVH and  diffuse T wave inversion in anterolateral and inferior leads   Telemetry: Telemetry was personally reviewed and demonstrates sinus rhythm with heart rates in the 80's to 110's.   Relevant CV Studies: None.  Laboratory Data:  Chemistry Recent Labs  Lab 04/11/18 1539  NA 141  K 4.0  CL 109  CO2 26  GLUCOSE 96  BUN 10  CREATININE 0.95  CALCIUM 8.8*  GFRNONAA 60*  GFRAA >60  ANIONGAP 6    No results for input(s): PROT, ALBUMIN, AST, ALT, ALKPHOS, BILITOT in the last 168 hours. Hematology Recent Labs  Lab 04/11/18 1539  WBC 9.6  RBC 4.84  HGB  13.0  HCT 41.4  MCV 85.5  MCH 26.9  MCHC 31.4  RDW 13.3  PLT 280   Cardiac EnzymesNo results for input(s): TROPONINI in the last 168 hours.  Recent Labs  Lab 04/11/18 1550  TROPIPOC 0.51*    BNP Recent Labs  Lab 04/11/18 1539  BNP 740.5*    DDimer  Recent Labs  Lab 04/11/18 1701  DDIMER 0.37    Radiology/Studies:  Dg Chest Portable 1 View  Result Date: 04/11/2018 CLINICAL DATA:  Chest pain and shortness of breath for 2 days. EXAM: PORTABLE CHEST 1 VIEW COMPARISON:  07/09/2003 FINDINGS: The cardiac silhouette, mediastinal and hilar contours are within normal limits and stable. There is mild tortuosity and calcification of the thoracic aorta. The lungs demonstrate mild hyperinflation and probable mild emphysematous changes. Bibasilar streaky scarring type changes but no infiltrates or effusions. No worrisome pulmonary lesions. There is a moderate-sized hiatal hernia. The bony thorax is intact. IMPRESSION: No acute cardiopulmonary findings. Electronically Signed   By: Rudie MeyerP.  Gallerani M.D.   On: 04/11/2018 17:18    Assessment and Plan:   NSTEMI - Patient presents with "chest heaviness" that occurred after eating with some shortness of breath.  - EKG showed LVH with diffuse T wave inversion. - I-stat troponin elevated at 0.51. Will continue to trend.  - D-dimer negative.  - Will check Echo. - Will check lipid panel and hemoglobin A1c. - Will add lose dose beta blocker. - Patient scheduled for cardiac catheterization tomorrow. The patient understands that risks include but are not limited to stroke (1 in 1000), death (1 in 1000), kidney failure [usually temporary] (1 in 500), bleeding (1 in 200), allergic reaction [possibly serious] (1 in 200), and agrees to proceed.   Anxiety - Will continue home Paxil 20mg  daily.  Severity of Illness: The appropriate patient status for this patient is INPATIENT. Inpatient status is judged to be reasonable and necessary in order to provide  the required intensity of service to ensure the patient's safety. The patient's presenting symptoms, physical  exam findings, and initial radiographic and laboratory data in the context of their chronic comorbidities is felt to place them at high risk for further clinical deterioration. Furthermore, it is not anticipated that the patient will be medically stable for discharge from the hospital within 2 midnights of admission. The following factors support the patient status of inpatient.   " The patient's presenting symptoms include chest pain and shortness of breath. " The worrisome physical exam findings are unremarkable. " The initial radiographic and laboratory data are worrisome because of elevated troponin. " The chronic co-morbidities include possible hyperlipidemia.   * I certify that at the point of admission it is my clinical judgment that the patient will require inpatient hospital care spanning beyond 2 midnights from the point of admission due to high intensity of service, high risk for further deterioration and high frequency of surveillance required.*    For questions or updates, please contact [facility] Please consult [email or web address] for contact info under    Signed, Corrin Parker, PA-C  04/11/2018 6:00 PM   I have seen and examined the patient and agree with the findings and plan, as documented in the PA/NP's note, with the following additions/changes.  Ms. Munroe is a 73 y/o woman with GERD, hiatal hernia s/p repair in 2005, anxiety, and remote tobacco, presenting to the ED for evaluation of chest pain and shortness of breath.  She reports intermittent shortness of breath for years, which she has attributed to anxiety.  Yesterday, she developed a pressure-like pain in her chest and neck while eating.  It was reminiscent of what she experienced prior to her hiatal hernia repair.  However, it was also accompanied by shortness of breath.  It resolved spontaneous but recurred  intermittently later in the day.  This morning, she developed vague jaw discomfort (without chest pain).  She had recently run out of omeprazole and thought that her GERD may be contributing to her symptoms.  She proceeded to her PCP for further evaluation and was noted to have EKG changes.  She was therefore referred to the ED for further evaluation.  At this time, she is asymptomatic, denying chest pain and shortness of breath.  She notes that the chest pain was worsened by deep inspiration.  She denies orthopnea, PND, and edema.  She travelled to [place] a few weeks ago by car but otherwise denies recent immobilization, prior cancer, and history of DVT/PE.  Exam is notable for tachycardic but regular rhythm w/o murmurs.  Lungs are clear.  Abdomen is soft and NT/ND.  There is no LE edema or JVD.  Labs are notable for POC troponin of 0.53.  BNP in 741.  D-dimer is 0.37 (negative)  BMP and CBC are normal.  EKG (personally reviewed) shows NSR with LVH, poor R-wave progression, and anterolateral T-wave inversions.  CXR shows no acute cardiopulmonary disease.  Presentation is atypical for acute MI, though EKG changes and elevated POC Tn are certainly concerning for ACS.  BNP is also elevated, through clinically the patient does not appear to have decompensated heart failure.  PE is a consideration, though lack of risk factors and negative d-dimer argue against this.  Patient has already been started on heparin infusion, which should be continued.  Given lack of symptoms at this time and no ST elevation on EKG, we will defer cardiac catheterization until tomorrow morning.  I have reviewed the risks, indications, and alternatives to cardiac catheterization, possible angioplasty, and stenting with the patient. Risks include but  are not limited to bleeding, infection, vascular injury, stroke, myocardial infection, arrhythmia, kidney injury, radiation-related injury in the case of prolonged fluoroscopy use, emergency  cardiac surgery, and death. The patient understands the risks of serious complication is 1-2 in 1000 with diagnostic cardiac cath and 1-2% or less with angioplasty/stenting.  We will continue ASA 81 mg daily and add metoprolol tartrate 12.5 mg BID as we as atorvastatin 80 mg daily.  Yvonne Kendall, MD [facility] Pager: [phone]

## 2018-04-11 NOTE — Progress Notes (Addendum)
ANTICOAGULATION CONSULT NOTE   Pharmacy Consult for Heparin Indication: chest pain/ACS  No Known Allergies  Patient Measurements: Height: 5\' 9"  (175.3 cm) Weight: 195 lb 4.8 oz (88.6 kg) IBW/kg (Calculated) : 66.2 Heparin Dosing Weight: 84.3 kg  Vital Signs: Temp: 100.1 F (37.8 C) (02/13 2002) Temp Source: Oral (02/13 2002) BP: 132/75 (02/13 2002) Pulse Rate: 99 (02/13 2002)  Labs: Recent Labs    04/11/18 1539 04/11/18 1900 04/11/18 2234  HGB 13.0  --   --   HCT 41.4  --   --   PLT 280  --   --   HEPARINUNFRC  --   --  0.19*  CREATININE 0.95  --   --   TROPONINI  --  0.58*  --     Estimated Creatinine Clearance: 63.5 mL/min (by C-G formula based on SCr of 0.95 mg/dL).   Medical History: Past Medical History:  Diagnosis Date  . Anxiety   . GERD (gastroesophageal reflux disease)    Assessment: 73 yo F presents with chest heaviness. CBC stable. Troponin elevated at 0.51.  2/13 PM update: heparin level sub-therapeutic, no issues per RN.   Goal of Therapy:  Heparin level 0.3-0.7 units/ml Monitor platelets by anticoagulation protocol: Yes   Plan:  Inc heparin to 1200 units/hr Heparin level with AM labs  Abran DukeLedford, Hazelene Doten 04/11/2018,11:16 PM ======================================== Addendum 2:55 AM Heparin level down to 0.18 this AM -Heparin 2000 units BOLUS -Inc heparin to 1300 units/hr -Re-check heparin level in 8 hours Abran DukeJames Andrej Spagnoli, PharmD, BCPS Clinical Pharmacist Phone: [phone] ========================================

## 2018-04-11 NOTE — ED Triage Notes (Addendum)
Pt endorses chest heaviness since last night, but stopping around 0600. Pt reports feeling lightheaded and leg heaviness since "mid day." Pt denies any current leg heaviness. No current chest pain. Pt is alert and oriented, no neuro deficits noted.Pt reports she went to PCP today and sent here due to irregular EKG and BP of 80/60 sitting. Pt reports took regular strength Bayer ASA at 0700.

## 2018-04-11 NOTE — ED Notes (Addendum)
XR at bedside. Heparin verified with Autumn, RN.

## 2018-04-11 NOTE — ED Notes (Signed)
ED Provider at bedside. 

## 2018-04-11 NOTE — Progress Notes (Signed)
Lab notified of Troponin value of 0.58. Per MD notes this is consistent with previous value that MD is aware of. Patient resting comfortably with no pain and vital signs stable.

## 2018-04-11 NOTE — ED Notes (Signed)
Cardiology at bedside.

## 2018-04-12 ENCOUNTER — Encounter (HOSPITAL_COMMUNITY)
Admission: EM | Disposition: A | Discharge status: Home / Self Care | Payer: Self-pay | Source: Home / Self Care | Attending: Internal Medicine

## 2018-04-12 ENCOUNTER — Inpatient Hospital Stay (HOSPITAL_COMMUNITY): Payer: Medicare Other

## 2018-04-12 ENCOUNTER — Telehealth: Payer: Self-pay | Admitting: Adult Health

## 2018-04-12 ENCOUNTER — Encounter (HOSPITAL_COMMUNITY): Payer: Self-pay | Admitting: Cardiovascular Disease

## 2018-04-12 DIAGNOSIS — I5041 Acute combined systolic (congestive) and diastolic (congestive) heart failure: Secondary | ICD-10-CM

## 2018-04-12 DIAGNOSIS — I2510 Atherosclerotic heart disease of native coronary artery without angina pectoris: Principal | ICD-10-CM

## 2018-04-12 DIAGNOSIS — R7989 Other specified abnormal findings of blood chemistry: Secondary | ICD-10-CM

## 2018-04-12 DIAGNOSIS — I5042 Chronic combined systolic (congestive) and diastolic (congestive) heart failure: Secondary | ICD-10-CM

## 2018-04-12 DIAGNOSIS — R079 Chest pain, unspecified: Secondary | ICD-10-CM

## 2018-04-12 DIAGNOSIS — R778 Other specified abnormalities of plasma proteins: Secondary | ICD-10-CM

## 2018-04-12 DIAGNOSIS — I5031 Acute diastolic (congestive) heart failure: Secondary | ICD-10-CM | POA: Diagnosis present

## 2018-04-12 HISTORY — PX: LEFT HEART CATH AND CORONARY ANGIOGRAPHY: CATH118249

## 2018-04-12 HISTORY — DX: Chronic combined systolic (congestive) and diastolic (congestive) heart failure: I50.42

## 2018-04-12 LAB — BASIC METABOLIC PANEL
Anion gap: 7 (ref 5–15)
BUN: 10 mg/dL (ref 8–23)
CO2: 26 mmol/L (ref 22–32)
Calcium: 8.6 mg/dL — ABNORMAL LOW (ref 8.9–10.3)
Chloride: 108 mmol/L (ref 98–111)
Creatinine, Ser: 1.08 mg/dL — ABNORMAL HIGH (ref 0.44–1.00)
GFR calc Af Amer: 59 mL/min — ABNORMAL LOW (ref >60)
GFR calc non Af Amer: 51 mL/min — ABNORMAL LOW (ref >60)
Glucose, Bld: 109 mg/dL — ABNORMAL HIGH (ref 70–99)
Potassium: 4 mmol/L (ref 3.5–5.1)
Sodium: 141 mmol/L (ref 135–145)

## 2018-04-12 LAB — TROPONIN I
Troponin I: 0.41 ng/mL (ref <0.03)
Troponin I: 0.37 ng/mL (ref <0.03)

## 2018-04-12 LAB — LIPID PANEL
Cholesterol: 160 mg/dL (ref 0–200)
HDL: 47 mg/dL (ref >40)
LDL Cholesterol: 101 mg/dL — ABNORMAL HIGH (ref 0–99)
Total CHOL/HDL Ratio: 3.4 RATIO
Triglycerides: 59 mg/dL (ref <150)
VLDL: 12 mg/dL (ref 0–40)

## 2018-04-12 LAB — HEPARIN LEVEL (UNFRACTIONATED): Heparin Unfractionated: 0.18 IU/mL — ABNORMAL LOW (ref 0.30–0.70)

## 2018-04-12 LAB — ECHOCARDIOGRAM COMPLETE
Height: 69 in
Weight: 3124.8 oz

## 2018-04-12 SURGERY — LEFT HEART CATH AND CORONARY ANGIOGRAPHY
Anesthesia: LOCAL

## 2018-04-12 MED ORDER — SODIUM CHLORIDE 0.9 % IV SOLN: INTRAVENOUS | Status: AC

## 2018-04-12 MED ORDER — VERAPAMIL HCL 2.5 MG/ML IV SOLN
INTRAVENOUS | Status: DC | PRN
  Administered 2018-04-12: 10 mL via INTRA_ARTERIAL

## 2018-04-12 MED ORDER — HEPARIN (PORCINE) IN NACL 1000-0.9 UT/500ML-% IV SOLN
INTRAVENOUS | Status: DC | PRN
  Administered 2018-04-12: 500 mL

## 2018-04-12 MED ORDER — IOHEXOL 350 MG/ML SOLN
INTRAVENOUS | Status: DC | PRN
  Administered 2018-04-12: 75 mL via INTRA_ARTERIAL

## 2018-04-12 MED ORDER — SODIUM CHLORIDE 0.9 % IV SOLN: 250.0000 mL | INTRAVENOUS | Status: DC | PRN

## 2018-04-12 MED ORDER — MIDAZOLAM HCL 2 MG/2ML IJ SOLN
INTRAMUSCULAR | Status: DC | PRN
  Administered 2018-04-12: 1 mg via INTRAVENOUS

## 2018-04-12 MED ORDER — FENTANYL CITRATE (PF) 100 MCG/2ML IJ SOLN
INTRAMUSCULAR | Status: AC
  Filled 2018-04-12: qty 2

## 2018-04-12 MED ORDER — SODIUM CHLORIDE 0.9% FLUSH: 3.0000 mL | Freq: Two times a day (BID) | INTRAVENOUS | Status: DC

## 2018-04-12 MED ORDER — ATORVASTATIN CALCIUM 40 MG PO TABS: 40.0000 mg | ORAL_TABLET | Freq: Every day | ORAL | 2 refills | Status: DC

## 2018-04-12 MED ORDER — MIDAZOLAM HCL 2 MG/2ML IJ SOLN
INTRAMUSCULAR | Status: AC
  Filled 2018-04-12: qty 2

## 2018-04-12 MED ORDER — HEPARIN SODIUM (PORCINE) 1000 UNIT/ML IJ SOLN
INTRAMUSCULAR | Status: DC | PRN
  Administered 2018-04-12: 4500 [IU] via INTRAVENOUS

## 2018-04-12 MED ORDER — HEPARIN SODIUM (PORCINE) 1000 UNIT/ML IJ SOLN
INTRAMUSCULAR | Status: AC
  Filled 2018-04-12: qty 1

## 2018-04-12 MED ORDER — VERAPAMIL HCL 2.5 MG/ML IV SOLN
INTRAVENOUS | Status: AC
  Filled 2018-04-12: qty 2

## 2018-04-12 MED ORDER — SODIUM CHLORIDE 0.9% FLUSH: 3.0000 mL | INTRAVENOUS | Status: DC | PRN

## 2018-04-12 MED ORDER — LIDOCAINE HCL (PF) 1 % IJ SOLN
INTRAMUSCULAR | Status: DC | PRN
  Administered 2018-04-12: 2 mL

## 2018-04-12 MED ORDER — HEPARIN BOLUS VIA INFUSION
2000.0000 [IU] | Freq: Once | INTRAVENOUS | Status: AC
  Administered 2018-04-12: 2000 [IU] via INTRAVENOUS
  Filled 2018-04-12: qty 2000

## 2018-04-12 MED ORDER — HEPARIN (PORCINE) IN NACL 1000-0.9 UT/500ML-% IV SOLN
INTRAVENOUS | Status: AC
  Filled 2018-04-12: qty 1000

## 2018-04-12 MED ORDER — LIDOCAINE HCL (PF) 1 % IJ SOLN
INTRAMUSCULAR | Status: AC
  Filled 2018-04-12: qty 30

## 2018-04-12 MED ORDER — FENTANYL CITRATE (PF) 100 MCG/2ML IJ SOLN
INTRAMUSCULAR | Status: DC | PRN
  Administered 2018-04-12: 25 ug via INTRAVENOUS

## 2018-04-12 MED ORDER — ATORVASTATIN CALCIUM 40 MG PO TABS
40.0000 mg | ORAL_TABLET | Freq: Every day | ORAL | Status: DC
  Administered 2018-04-12: 40 mg via ORAL
  Filled 2018-04-12: qty 1

## 2018-04-12 MED ORDER — NITROGLYCERIN 0.4 MG SL SUBL: 0.4000 mg | SUBLINGUAL_TABLET | SUBLINGUAL | 6 refills | Status: AC | PRN

## 2018-04-12 SURGICAL SUPPLY — 11 items
CATH INFINITI 5 FR JL3.5 (CATHETERS) ×2 IMPLANT
CATH INFINITI 5FR JK (CATHETERS) ×2 IMPLANT
CATH INFINITI JR4 5F (CATHETERS) ×2 IMPLANT
DEVICE RAD COMP TR BAND LRG (VASCULAR PRODUCTS) ×2 IMPLANT
GLIDESHEATH SLEND SS 6F .021 (SHEATH) ×2 IMPLANT
GUIDEWIRE INQWIRE 1.5J.035X260 (WIRE) ×1 IMPLANT
INQWIRE 1.5J .035X260CM (WIRE) ×2
KIT HEART LEFT (KITS) ×2 IMPLANT
PACK CARDIAC CATHETERIZATION (CUSTOM PROCEDURE TRAY) ×2 IMPLANT
TRANSDUCER W/STOPCOCK (MISCELLANEOUS) ×2 IMPLANT
TUBING CIL FLEX 10 FLL-RA (TUBING) ×2 IMPLANT

## 2018-04-12 NOTE — Progress Notes (Signed)
04/12/2018 7:35 PM Discharge AVS meds taken today and those due this evening reviewed.  Follow-up appointments and when to call md reviewed.  D/C IV and TELE.  Questions and concerns addressed.   D/C home per orders. [name]

## 2018-04-12 NOTE — Progress Notes (Signed)
[id number] MI education completed with pt and husband who voiced understanding. Stressed importance of MI restrictions, heart healthy food choices and low sodium, ex ed and CRP 2. Gave takotsubo article and discussed syndrome. Referred to [place] CRP 2. Luetta Nutting RN BSN 04/12/2018 2:39 PM

## 2018-04-12 NOTE — Telephone Encounter (Signed)
Patient currently admitted

## 2018-04-12 NOTE — Discharge Summary (Signed)
Discharge Summary    Patient ID: Meagan Stout MRN: [id number]; DOB: Jul 18, 1945  Admit date: 04/11/2018 Discharge date: 04/12/2018  Primary Care Provider: Lorenda Ishihara, MD  Primary Cardiologist: New Consult (Dr. Duke Salvia) Primary Electrophysiologist:  None   Discharge Diagnoses    Principal Problem:   Chest pain Active Problems:   Elevated troponin   Acute diastolic heart failure (HCC)   Allergies No Known Allergies  Diagnostic Studies/Procedures    Left Heart Catheterization 04/12/2018:  Mid RCA lesion is 30% stenosed.  There is mild left ventricular systolic dysfunction.  LV end diastolic pressure is normal.  The left ventricular ejection fraction is 45-50% by visual estimate.  1st Mrg lesion is 20% stenosed.  Prox LAD lesion is 20% stenosed.   1.  Mild nonobstructive coronary artery disease. 2.  Mildly reduced LV systolic function with an EF of 45% with apical hypokinesis suggestive of stress-induced cardiomyopathy. 3.  High normal left ventricular end-diastolic pressure.  Recommendations: I do not see any culprit for non-ST elevation myocardial infarction.  Wall motion abnormalities suggestive of stress-induced cardiomyopathy.  Recommend medical therapy.  Obtain an echocardiogram.  Diagnostic  Dominance: Right       _____________   Echocardiogram 04/12/2018: Impressions:  1. The left ventricle has normal systolic function, with an ejection fraction of 55-60%. The cavity size was normal. Left ventricular diastolic Doppler parameters are consistent with impaired relaxation.  2. The right ventricle has normal systolic function. The cavity was normal. There is no increase in right ventricular wall thickness.  3. Left atrial size was mildly dilated.  4. The mitral valve is normal in structure.  5. The tricuspid valve is normal in structure.  6. The aortic valve is normal in structure.  7. The pulmonic valve was normal in structure.  8. The  inferior vena cava was dilated in size with >50% respiratory variability.  9. Right atrial pressure is estimated at 8 mmHg.  History of Present Illness     Meagan Stout is a 73 year old female with a history of GERD, hiatal hernia s/p repair, anxiety, and remote tobacco use but no known cardiac history. Patient states she had a cardiac catheterization in her late teens/early 20's to assess an irregular heart rate after having a severe case of mono. Patient reports the catheterization was normal. Patient saw Dr. Katrinka Blazing and had stress test done about 20 years ago for shortness of breath. Stress test was normal and symptoms were felt to be due to anxiety. Patient was started on Paxil and symptoms resolved.   Patient presented to the [facility] ED for evaluation of chest pain. Patient reports onset of substernal chest pain 04/10/2018 after eating lunch. Patient describes the pain as a "heavy ache" and states it would not let up. Patient initially thought it was just reflux so she took Protonix but that did not help. As the day went on, the pain started to radiated up to her neck and to her back. This was similar to previous pain with GERD and hiatal hernia so patient did not think too much about it. Pain is worse when she takes a deep breath. Patient and husband drove to [place] a few weeks ago. Patient denies any other recent travel or surgery. She has never had a DVT/PE before. Pain persisted throughout the night and woke her up throughout the night. Around 5:30am, she woke up with aching in her jaw and some nausea but she was able to go back to sleep. When she  woke up later, pain was completely gone and she felt fine. She went to work as usual but later in the day she started to have some weakness in her legs as well as some lightheadedness, shortness of breath, dizziness, and palpitations. She went to see her PCP who then sent the patient to the ED due to irregular EKG and hypotension with systolic BP in the  80's.   Patient denies any exertional chest pain although she has not been very active over the last year due to multiple torn ligaments in her left knee. She does report some shortness of breath for "quite some time" where she feels "air hungry." She thinks this shortness of breath is getting a little worse. She denies any orthopnea or edema. She denies any recent fevers or illnesses.   Upon arrival to the ED, vitals stable with BP of 122/79. EKG showed normal sinus rhythm with LVH and diffuse T wave inversion in multiple (no prior tracings available for comparison). I-stat troponin elevated at 0.51. BNP elevated at 740.5. CBC unremarkable. Na 141, K 4.0, Glucose 96, SCr 0.95. At the time of this evaluation, patient is chest pain free.   Patient is a former smoker but quit about 45 years ago. She reports occasional alcohol use (1-2 times per week) but denies any recreational drug use. Patient has a family history of heart disease with her father having a heart attack at the age of 78.   Hospital Course     Consultants: None.  Ms. Shambach was admitted on 04/11/2018 for possible ACS as stated above. Repeat troponin I mildly elevated  at 0.58 >> 0.41 >> 0.37. D-dimer negative. Left heart catheterization showed mild non-obstructive CAD with 30% stenosis of mid RCA, 20% stenosis of proximal LAD, and 20% stenosis of 1st Marginal which does not explain her EKG changes or elevated troponin. LV systolic function was noted to be mildly reduced with EF of 45% and apical hypokinesis suggestive of stress-induced cardiomyopathy. Patient denies any recent stressors. Echocardiogram showed normal LV with EF of 55-60% with impaired relaxation. Patient initially started on low dose beta-blocker; however, patient's blood pressure have been low since with systolic BP in the 90's. Will discontinue beta-blocker. Most recent BP 101/59. Patient likely won't be able to tolerate ACEi/ARB either. Patient has a manual blood  pressure cuff at home and will monitor her BP over the next couple of days.   LDL 101 this admission. Patient was started on Lipitor 40mg  daily. Will need repeat lipid panel and CMP in 6 weeks.   Hemoglobin A1c 6.1% this admission which is consistent with pre-diabetes. Will defer to PCP.   Patient seen and examined by Dr. Duke Salvia today and determined to be stable for discharge. Outpatient follow-up has been arranged. Medications as below.   Discharge Vitals Blood pressure (!) 101/59, pulse 81, temperature 98.9 F (37.2 C), temperature source Oral, resp. rate 12, height 5\' 9"  (1.753 m), weight 88.6 kg, SpO2 99 %.  Filed Weights   04/11/18 1511 04/11/18 2002  Weight: 88 kg 88.6 kg    Labs & Radiologic Studies    CBC Recent Labs    04/11/18 1539  WBC 9.6  NEUTROABS 6.5  HGB 13.0  HCT 41.4  MCV 85.5  PLT 280   Basic Metabolic Panel Recent Labs    14/38/88 1539 04/12/18 0642  NA 141 141  K 4.0 4.0  CL 109 108  CO2 26 26  GLUCOSE 96 109*  BUN 10 10  CREATININE  0.95 1.08*  CALCIUM 8.8* 8.6*   Liver Function Tests No results for input(s): AST, ALT, ALKPHOS, BILITOT, PROT, ALBUMIN in the last 72 hours. No results for input(s): LIPASE, AMYLASE in the last 72 hours. Cardiac Enzymes Recent Labs    04/11/18 1900 04/12/18 0045 04/12/18 0642  TROPONINI 0.58* 0.41* 0.37*   BNP Invalid input(s): POCBNP D-Dimer Recent Labs    04/11/18 1701  DDIMER 0.37   Hemoglobin A1C Recent Labs    04/11/18 1915  HGBA1C 6.1*   Fasting Lipid Panel Recent Labs    04/12/18 0045  CHOL 160  HDL 47  LDLCALC 101*  TRIG 59  CHOLHDL 3.4   Thyroid Function Tests No results for input(s): TSH, T4TOTAL, T3FREE, THYROIDAB in the last 72 hours.  Invalid input(s): FREET3 _____________  Dg Chest Portable 1 View  Result Date: 04/11/2018 CLINICAL DATA:  Chest pain and shortness of breath for 2 days. EXAM: PORTABLE CHEST 1 VIEW COMPARISON:  07/09/2003 FINDINGS: The cardiac  silhouette, mediastinal and hilar contours are within normal limits and stable. There is mild tortuosity and calcification of the thoracic aorta. The lungs demonstrate mild hyperinflation and probable mild emphysematous changes. Bibasilar streaky scarring type changes but no infiltrates or effusions. No worrisome pulmonary lesions. There is a moderate-sized hiatal hernia. The bony thorax is intact. IMPRESSION: No acute cardiopulmonary findings. Electronically Signed   By: Rudie Meyer M.D.   On: 04/11/2018 17:18   Disposition   Patient is being discharged home today in good condition.  Follow-up Plans & Appointments    Follow-up Information    [facility] Follow up.   Specialty:  Cardiology Why:  You have a hospital follow-up visit scheduled for 04/24/2018 at 9:30am with Joni Reining, one of our practice's nurse practitioners. Please arrive 15 minutes early for check in. Contact information: [address] [place] [address] [phone]       Lorenda Ishihara, MD Follow up.   Specialty:  Internal Medicine Why:  Please call your primary care physician's office to schedule an appointment in the next 1-2 weeks. Contact information: [address] [place] [address] [phone]          Discharge Instructions    Amb Referral to Cardiac Rehabilitation   Complete by:  As directed    Diagnosis:  NSTEMI   Diet - low sodium heart healthy   Complete by:  As directed    Increase activity slowly   Complete by:  As directed       Discharge Medications   Allergies as of 04/12/2018   No Known Allergies     Medication List    TAKE these medications   atorvastatin 40 MG tablet Commonly known as:  LIPITOR Take 1 tablet (40 mg total) by mouth daily at 6 PM.   calcium carbonate 500 MG chewable tablet Commonly known as:  TUMS - dosed in mg elemental calcium Chew 1-2 tablets by mouth as needed for indigestion or  heartburn.   CALCIUM PO Take 1 tablet by mouth daily with breakfast.   cetirizine 10 MG tablet Commonly known as:  ZYRTEC Take 10 mg by mouth daily.   fluticasone 50 MCG/ACT nasal spray Commonly known as:  FLONASE Place 2 sprays into both nostrils daily.   glycopyrrolate 1 MG tablet Commonly known as:  ROBINUL Take 1 mg by mouth daily as needed (for IBS symptoms).   ibuprofen 200 MG tablet Commonly known as:  ADVIL,MOTRIN Take 400-600 mg  by mouth every 6 (six) hours as needed (for pain or inflammation).   loperamide 2 MG tablet Commonly known as:  IMODIUM A-D Take 2 mg by mouth 4 (four) times daily as needed for diarrhea or loose stools.   nitroGLYCERIN 0.4 MG SL tablet Commonly known as:  NITROSTAT Place 1 tablet (0.4 mg total) under the tongue every 5 (five) minutes x 3 doses as needed for chest pain.   pantoprazole 40 MG tablet Commonly known as:  PROTONIX Take 40 mg by mouth at bedtime.   PARoxetine 20 MG tablet Commonly known as:  PAXIL Take 20 mg by mouth at bedtime.   SYSTANE COMPLETE OP Place 2 drops into both eyes 2 (two) times daily as needed (for dryness).   VITAMIN D-3 PO Take 1 capsule by mouth 2 (two) times daily.        Acute coronary syndrome (MI, NSTEMI, STEMI, etc) this admission?:  No. The elevated Troponin was due to the acute medical illness or demand ischemia.    Outstanding Labs/Studies   Repeat lipid panel and CMP in 6 weeks.   Duration of Discharge Encounter   Greater than 30 minutes including physician time.  Signed, Corrin Parkerallie E Liberty Stead, PA-C 04/12/2018, 6:33 PM

## 2018-04-12 NOTE — Progress Notes (Signed)
Progress Note  Patient Name: Meagan Stout Date of Encounter: 04/12/2018  Primary Cardiologist: No primary care provider on file.   Subjective   No recurrent chest pain.  Breathing is stable.  She denies lightheaded or dizziness but has not been out of bed.  She denies any significant recent stressors.  Inpatient Medications    Scheduled Meds: . aspirin EC  81 mg Oral Daily  . atorvastatin  40 mg Oral q1800  . pantoprazole  40 mg Oral QHS  . PARoxetine  20 mg Oral QHS  . sodium chloride flush  3 mL Intravenous Q12H   Continuous Infusions: . sodium chloride    . sodium chloride     PRN Meds: sodium chloride, acetaminophen, calcium carbonate, glycopyrrolate, loperamide, nitroGLYCERIN, ondansetron (ZOFRAN) IV, sodium chloride flush   Vital Signs    Vitals:   04/12/18 0014 04/12/18 0445 04/12/18 0941 04/12/18 1050  BP: (!) 91/47 (!) 93/52 (!) 94/53   Pulse: 71 67 (!) 58   Resp: 13 17    Temp: 99.1 F (37.3 C) 98.9 F (37.2 C)    TempSrc: Oral Oral    SpO2: 94% 97%  100%  Weight:      Height:        Intake/Output Summary (Last 24 hours) at 04/12/2018 1416 Last data filed at 04/12/2018 0949 Gross per 24 hour  Intake 378.48 ml  Output -  Net 378.48 ml   Last 3 Weights 04/11/2018 04/11/2018  Weight (lbs) 195 lb 4.8 oz 194 lb  Weight (kg) 88.587 kg 87.998 kg      Telemetry    Sinus rhythm.  No events- Personally Reviewed  ECG    Sinus rhythm.  Rate 65 bpm.  Pronounced T wave inversions anterolaterally and inferior.- Personally Reviewed  Physical Exam   VS:  BP (!) 94/53   Pulse (!) 58   Temp 98.9 F (37.2 C) (Oral)   Resp 17   Ht 5\' 9"  (1.753 m)   Wt 88.6 kg   SpO2 100%   BMI 28.84 kg/m  , BMI Body mass index is 28.84 kg/m. GENERAL:  Well appearing HEENT: Pupils equal round and reactive, fundi not visualized, oral mucosa unremarkable NECK:  No jugular venous distention, waveform within normal limits, carotid upstroke brisk and symmetric, no  bruits LUNGS:  Clear to auscultation bilaterally HEART:  RRR.  PMI not displaced or sustained,S1 and S2 within normal limits, no S3, no S4, no clicks, no rubs, no murmurs ABD:  Flat, positive bowel sounds normal in frequency in pitch, no bruits, no rebound, no guarding, no midline pulsatile mass, no hepatomegaly, no splenomegaly EXT:  2 plus pulses throughout, no edema, no cyanosis no clubbing.  TR band in place. SKIN:  No rashes no nodules NEURO:  Cranial nerves II through XII grossly intact, motor grossly intact throughout [facility]:  Cognitively intact, oriented to person place and time   Labs    Chemistry Recent Labs  Lab 04/11/18 1539 04/12/18 0642  NA 141 141  K 4.0 4.0  CL 109 108  CO2 26 26  GLUCOSE 96 109*  BUN 10 10  CREATININE 0.95 1.08*  CALCIUM 8.8* 8.6*  GFRNONAA 60* 51*  GFRAA >60 59*  ANIONGAP 6 7     Hematology Recent Labs  Lab 04/11/18 1539  WBC 9.6  RBC 4.84  HGB 13.0  HCT 41.4  MCV 85.5  MCH 26.9  MCHC 31.4  RDW 13.3  PLT 280    Cardiac Enzymes  Recent Labs  Lab 04/11/18 1900 04/12/18 0045 04/12/18 0642  TROPONINI 0.58* 0.41* 0.37*    Recent Labs  Lab 04/11/18 1550  TROPIPOC 0.51*     BNP Recent Labs  Lab 04/11/18 1539  BNP 740.5*     DDimer  Recent Labs  Lab 04/11/18 1701  DDIMER 0.37     Radiology    Dg Chest Portable 1 View  Result Date: 04/11/2018 CLINICAL DATA:  Chest pain and shortness of breath for 2 days. EXAM: PORTABLE CHEST 1 VIEW COMPARISON:  07/09/2003 FINDINGS: The cardiac silhouette, mediastinal and hilar contours are within normal limits and stable. There is mild tortuosity and calcification of the thoracic aorta. The lungs demonstrate mild hyperinflation and probable mild emphysematous changes. Bibasilar streaky scarring type changes but no infiltrates or effusions. No worrisome pulmonary lesions. There is a moderate-sized hiatal hernia. The bony thorax is intact. IMPRESSION: No acute cardiopulmonary findings.  Electronically Signed   By: Rudie Meyer M.D.   On: 04/11/2018 17:18    Cardiac Studies   LHC 04/12/18:  Mid RCA lesion is 30% stenosed.  There is mild left ventricular systolic dysfunction.  LV end diastolic pressure is normal.  The left ventricular ejection fraction is 45-50% by visual estimate.  1st Mrg lesion is 20% stenosed.  Prox LAD lesion is 20% stenosed.   1.  Mild nonobstructive coronary artery disease. 2.  Mildly reduced LV systolic function with an EF of 45% with apical hypokinesis suggestive of stress-induced cardiomyopathy. 3.  High normal left ventricular end-diastolic pressure.  Recommendations: I do not see any culprit for non-ST elevation myocardial infarction.  Wall motion abnormalities suggestive of stress-induced cardiomyopathy.  Recommend medical therapy.  Obtain an echocardiogram.  Patient Profile     73 y.o. female with GERD, hiatal hernia status post repair, anxiety, and prior tobacco abuse admitted with chest pain and elevated cardiac enzymes.  Assessment & Plan    # Chest pain: # Acute systolic and diastolic heart failure:  No recurrent chest pain since admission.  Her EKG shows some pronounced T wave inversions.  Left heart cath revealed only minimal CAD that does not explain her EKG changes or elevated troponin.  There is concern that this may be related to stress related cardiomyopathy.  However, she denies any recent stressors.  We will get an echocardiogram to better assess.  Her blood pressure is low after receiving metoprolol.  She does not have hypertension at baseline.  Unfortunately I do not think will be able to get a beta-blocker or ARB on board.  Echo is pending.  If stable she can be discharged home later today.  She will need 1 week follow up.   For questions or updates, please contact [facility] Please consult [email or web address] for contact info under        Signed, Chilton Si, MD  04/12/2018, 2:16 PM

## 2018-04-12 NOTE — Telephone Encounter (Signed)
TOC Appt per [name] on 02/26 at 9:30am with [name]

## 2018-04-12 NOTE — Discharge Instructions (Signed)
Post Cardiac Catheterization: NO HEAVY LIFTING OR SEXUAL ACTIVITY X 7 DAYS. NO DRIVING X 2-3 DAYS. NO SOAKING BATHS, HOT TUBS, POOLS, ETC., X 7 DAYS.  Radial Site Care: Refer to this sheet in the next few weeks. These instructions provide you with information on caring for yourself after your procedure. Your caregiver may also give you more specific instructions. Your treatment has been planned according to current medical practices, but problems sometimes occur. Call your caregiver if you have any problems or questions after your procedure. HOME CARE INSTRUCTIONS  You may shower the day after the procedure.Remove the bandage (dressing) and gently wash the site with plain soap and water.Gently pat the site dry.   Do not apply powder or lotion to the site.   Do not submerge the affected site in water for 3 to 5 days.   Inspect the site at least twice daily.   Do not flex or bend the affected arm for 24 hours.   No lifting over 5 pounds (2.3 kg) for 5 days after your procedure.   Do not drive home if you are discharged the same day of the procedure. Have someone else drive you.  What to expect:  Any bruising will usually fade within 1 to 2 weeks.   Blood that collects in the tissue (hematoma) may be painful to the touch. It should usually decrease in size and tenderness within 1 to 2 weeks.  SEEK IMMEDIATE MEDICAL CARE IF:  You have unusual pain at the radial site.   You have redness, warmth, swelling, or pain at the radial site.   You have drainage (other than a small amount of blood on the dressing).   You have chills.   You have a fever or persistent symptoms for more than 72 hours.   You have a fever and your symptoms suddenly get worse.   Your arm becomes pale, cool, tingly, or numb.   You have heavy bleeding from the site. Hold pressure on the site.   

## 2018-04-12 NOTE — Progress Notes (Signed)
  Echocardiogram 2D Echocardiogram has been performed.  Meagan Stout 04/12/2018, 3:40 PM

## 2018-04-12 NOTE — Interval H&P Note (Signed)
Cath Lab Visit (complete for each Cath Lab visit)  Clinical Evaluation Leading to the Procedure:   ACS: Yes.    Non-ACS:  n/a   History and Physical Interval Note:  04/12/2018 11:00 AM  Meagan Stout  has presented today for surgery, with the diagnosis of nstemi  The various methods of treatment have been discussed with the patient and family. After consideration of risks, benefits and other options for treatment, the patient has consented to  Procedure(s): LEFT HEART CATH AND CORONARY ANGIOGRAPHY (N/A) as a surgical intervention .  The patient's history has been reviewed, patient examined, no change in status, stable for surgery.  I have reviewed the patient's chart and labs.  Questions were answered to the patient's satisfaction.     Lorine Bears

## 2018-04-16 NOTE — Telephone Encounter (Signed)
Patient contacted regarding discharge from [facility] on 04-24-2018.  Patient understands to follow up with provider lawrence on 04-24-2018 at 9:30 am [facility]. Patient understands discharge instructions? yes Patient understands medications and regiment? yes Patient understands to bring all medications to this visit? yes

## 2018-04-19 ENCOUNTER — Other Ambulatory Visit: Payer: Self-pay | Admitting: Gastroenterology

## 2018-04-19 DIAGNOSIS — R0782 Intercostal pain: Secondary | ICD-10-CM

## 2018-04-19 DIAGNOSIS — K2100 Gastro-esophageal reflux disease with esophagitis, without bleeding: Secondary | ICD-10-CM

## 2018-04-22 ENCOUNTER — Telehealth (HOSPITAL_COMMUNITY): Payer: Self-pay

## 2018-04-22 NOTE — Telephone Encounter (Signed)
Pt insurance is active and benefits verified through [facility] Medicare. Co-pay $0.00, DED $200.00/$72.38 met, out of pocket $2,200.00/$72.38 met, co-insurance 0%. No pre-authorization required. Dee/[employer], 04/22/2018 @ 11:08AM, REF# [id number]  Will contact patient to see if she is interested in the Cardiac Rehab Program. If interested, patient will need to complete follow up appt. Once completed, patient will be contacted for scheduling upon review by the RN Navigator.

## 2018-04-22 NOTE — Telephone Encounter (Signed)
Called patient to see if she is interested in the Cardiac Rehab Program. Patient expressed interest. Explained scheduling process and went over insurance, patient verbalized understanding. Will contact patient for scheduling once f/u has been completed. 

## 2018-04-23 ENCOUNTER — Other Ambulatory Visit: Payer: Medicare Other

## 2018-04-23 ENCOUNTER — Ambulatory Visit
Admission: RE | Admit: 2018-04-23 | Discharge: 2018-04-23 | Disposition: A | Discharge status: Home / Self Care | Payer: Medicare Other | Source: Ambulatory Visit | Attending: Gastroenterology | Admitting: Gastroenterology

## 2018-04-23 DIAGNOSIS — K2100 Gastro-esophageal reflux disease with esophagitis, without bleeding: Secondary | ICD-10-CM

## 2018-04-23 DIAGNOSIS — R0782 Intercostal pain: Secondary | ICD-10-CM

## 2018-04-24 ENCOUNTER — Ambulatory Visit (INDEPENDENT_AMBULATORY_CARE_PROVIDER_SITE_OTHER): Payer: Medicare Other | Admitting: Adult Health

## 2018-04-24 ENCOUNTER — Encounter: Payer: Self-pay | Admitting: Adult Health

## 2018-04-24 VITALS — BP 100/70 | HR 83 | Ht 69.0 in | Wt 192.0 lb

## 2018-04-24 DIAGNOSIS — I2510 Atherosclerotic heart disease of native coronary artery without angina pectoris: Secondary | ICD-10-CM

## 2018-04-24 DIAGNOSIS — I5181 Takotsubo syndrome: Secondary | ICD-10-CM | POA: Diagnosis not present

## 2018-04-24 DIAGNOSIS — E7800 Pure hypercholesterolemia, unspecified: Secondary | ICD-10-CM | POA: Diagnosis not present

## 2018-04-24 NOTE — Patient Instructions (Signed)
Follow-Up: You will need a follow up appointment in 6 months.  Please call our office 2 months in advance (June 2020)  to schedule this (AUGUST 2020)  appointment.  You may see Meagan Si, MD or one of the following Advanced Practice Providers on your designated Care Team:  [name], [place]  [name], PA-C  [name], [place]     Medication Instructions:  NO CHANGES- Your physician recommends that you continue on your current medications as directed. Please refer to the Current Medication list given to you today. If you need a refill on your cardiac medications before your next appointment, please call your pharmacy. Labwork: When you have labs (blood work) and your tests are completely normal, you will receive your results ONLY by MyChart Message (if you have MyChart) -OR- A paper copy in the mail.  At [facility], you and your health needs are our priority.  As part of our continuing mission to provide you with exceptional heart care, we have created designated Provider Care Teams.  These Care Teams include your primary Cardiologist (physician) and Advanced Practice Providers (APPs -  Physician Assistants and Nurse Practitioners) who all work together to provide you with the care you need, when you need it.  Thank you for choosing [facility] at [facility]!!

## 2018-04-24 NOTE — Progress Notes (Signed)
Cardiology Office Note   Date:  04/24/2018   ID:  Meagan Stout, DOB [birth year], MRN [id number]  PCP:  Lorenda Ishihara, MD  Cardiologist: Dr.[place]  Chief Complaint  Patient presents with  . Coronary Artery Disease  . Cardiomyopathy    stress induced     History of Present Illness: Meagan Stout is a 73 y.o. female who presents for ongoing assessment and management of chest pain, post hospitalization on 04/10/2018 which occurred after eating lunch. This was similar to previous pain with GERD and hiatal hernia so patient did not think too much about it.Pain is worse when she takes a deep breath. She became symptomatic with weakness, in her legs with aching in her jaw.   She ultimately underwent cardiac cath which showed non-obstructive disease. LV function was mildly reduced with EF of 45% with apical hypokineses suggestive of stress induced CM. BB was discontinued as she was hypotensive, and would not tolerate ACE or ARB. She was started on Lipitor 40 mg for LDL of 101 noted on admission.    She comes today with multiple questions about how to move forward. She states she is under a lot of stress and also does a lot of public speaking. She has had panic attacks in the past. She wonders if this chest pain can occur again,   Past Medical History:  Diagnosis Date  . Anxiety   . GERD (gastroesophageal reflux disease)   . Non-Obstructive CAD    a. LHC 04/12/2018: 30% mRCA, 20% 1st Mrg, 20% pLAD. LVEF 45-50% w/ apical hypokinesis suggestive of stress-induced cardiomyopathy.    Past Surgical History:  Procedure Laterality Date  . HERNIA REPAIR    . LEFT HEART CATH AND CORONARY ANGIOGRAPHY N/A 04/12/2018   Procedure: LEFT HEART CATH AND CORONARY ANGIOGRAPHY;  Surgeon: Iran Ouch, MD;  Location: [facility];  Service: Cardiovascular;  Laterality: N/A;     Current Outpatient Medications  Medication Sig Dispense Refill  . atorvastatin (LIPITOR) 40 MG tablet Take 1  tablet (40 mg total) by mouth daily at 6 PM. 30 tablet 2  . calcium carbonate (TUMS - DOSED IN MG ELEMENTAL CALCIUM) 500 MG chewable tablet Chew 1-2 tablets by mouth as needed for indigestion or heartburn.    Marland Kitchen CALCIUM PO Take 1 tablet by mouth daily with breakfast.    . cetirizine (ZYRTEC) 10 MG tablet Take 10 mg by mouth daily.    . Cholecalciferol (VITAMIN D-3 PO) Take 1 capsule by mouth 2 (two) times daily.    . fluticasone (FLONASE) 50 MCG/ACT nasal spray Place 2 sprays into both nostrils daily.    Marland Kitchen ibuprofen (ADVIL,MOTRIN) 200 MG tablet Take 400-600 mg by mouth every 6 (six) hours as needed (for pain or inflammation).    Marland Kitchen loperamide (IMODIUM A-D) 2 MG tablet Take 2 mg by mouth 4 (four) times daily as needed for diarrhea or loose stools.    . pantoprazole (PROTONIX) 40 MG tablet Take 40 mg by mouth at bedtime.    Marland Kitchen PARoxetine (PAXIL) 20 MG tablet Take 20 mg by mouth at bedtime.    Marland Kitchen Propylene Glycol (SYSTANE COMPLETE OP) Place 2 drops into both eyes 2 (two) times daily as needed (for dryness).    Marland Kitchen glycopyrrolate (ROBINUL) 1 MG tablet Take 1 mg by mouth daily as needed (for IBS symptoms).     . nitroGLYCERIN (NITROSTAT) 0.4 MG SL tablet Place 1 tablet (0.4 mg total) under the tongue every 5 (five) minutes x  3 doses as needed for chest pain. (Patient not taking: Reported on 04/24/2018) 30 tablet 6   No current facility-administered medications for this visit.     Allergies:   Patient has no known allergies.    Social History:  The patient  reports that she has never smoked. She has never used smokeless tobacco. She reports current alcohol use.   Family History:  The patient's family history includes CAD in her father.    ROS: All other systems are reviewed and negative. Unless otherwise mentioned in H&P    PHYSICAL EXAM: VS:  BP 100/70   Pulse 83   Ht 5\' 9"  (1.753 m)   Wt 192 lb (87.1 kg)   BMI 28.35 kg/m  , BMI Body mass index is 28.35 kg/m. GEN: Well nourished, well  developed, in no acute distress HEENT: normal Neck: no JVD, carotid bruits, or masses Cardiac: RRR; no murmurs, rubs, or gallops,no edema  Respiratory:  Clear to auscultation bilaterally, normal work of breathing GI: soft, nontender, nondistended, + BS MS: no deformity or atrophy Skin: warm and dry, no rash Neuro:  Strength and sensation are intact Psych: euthymic mood, full affect   EKG:   Not competed this office visit   Recent Labs: 04/11/2018: B Natriuretic Peptide 740.5; Hemoglobin 13.0; Platelets 280 04/12/2018: BUN 10; Creatinine, Ser 1.08; Potassium 4.0; Sodium 141    Lipid Panel    Component Value Date/Time   CHOL 160 04/12/2018 0045   TRIG 59 04/12/2018 0045   HDL 47 04/12/2018 0045   CHOLHDL 3.4 04/12/2018 0045   VLDL 12 04/12/2018 0045   LDLCALC 101 (H) 04/12/2018 0045      Wt Readings from Last 3 Encounters:  04/24/18 192 lb (87.1 kg)  04/11/18 195 lb 4.8 oz (88.6 kg)      Other studies Reviewed: Left Heart Catheterization 04/12/2018:  Mid RCA lesion is 30% stenosed.  There is mild left ventricular systolic dysfunction.  LV end diastolic pressure is normal.  The left ventricular ejection fraction is 45-50% by visual estimate.  1st Mrg lesion is 20% stenosed.  Prox LAD lesion is 20% stenosed.  1. Mild nonobstructive coronary artery disease. 2. Mildly reduced LV systolic function with an EF of 45% with apical hypokinesis suggestive of stress-induced cardiomyopathy. 3. High normal left ventricular end-diastolic pressure.  Recommendations: I do not see any culprit for non-ST elevation myocardial infarction. Wall motion abnormalities suggestive of stress-induced cardiomyopathy. Recommend medical therapy. Obtain an echocardiogram.  Echocardiogram 04/12/2018: Impressions: 1. The left ventricle has normal systolic function, with an ejection fraction of 55-60%. The cavity size was normal. Left ventricular diastolic Doppler parameters are  consistent with impaired relaxation. 2. The right ventricle has normal systolic function. The cavity was normal. There is no increase in right ventricular wall thickness. 3. Left atrial size was mildly dilated. 4. The mitral valve is normal in structure. 5. The tricuspid valve is normal in structure. 6. The aortic valve is normal in structure. 7. The pulmonic valve was normal in structure. 8. The inferior vena cava was dilated in size with >50% respiratory variability. 9. Right atrial pressure is estimated at 8 mmHg.   ASSESSMENT AND PLAN:  1. Stress Induced Cardiomyopathy: She cannot be placed on any BB or ACE at this time due to soft BP. She is to avoid caffeine and any other stimulants that may cause her to have increased HR or stressful feeling.  Can consider Ranexa as an option if she continues to have these symptoms  with more frequency.   2. Hiatal Hernia: She will follow up with GI and/or PCP for ongoing recommendations.   3. Hypercholesterolemia: Continue atorvastatin 40 mg daily. Will need to recheck her lipid profile in 6 weeks to evaluate her response to medications.    Current medicines are reviewed at length with the patient today.    Labs/ tests ordered today include: Lipids and LFT's   Bettey Mare. Liborio Nixon, ANP, AACC   04/24/2018 12:06 PM    [facility] [address] Office [phone] Fax [phone]

## 2018-05-01 NOTE — Telephone Encounter (Signed)
Called and spoke with pt in regards to CR, pt stated she was about to have a meeting and will give [place] a call back later.   If pt does not return phone call, will follow up with pt in a week.

## 2018-05-02 NOTE — Telephone Encounter (Signed)
Pt called back and she was interested in participating in the Cardiac Rehab Program. Patient stated yes. Patient will come in for orientation on 05/23/2018 @ 8:00am and will attend the 2:45pm exercise class.  Mailed homework package. Tempie Donning. Support Rep II

## 2018-05-13 NOTE — Telephone Encounter (Signed)
Called pt to let her know that the department will be closed for 2 weeks.. pt verbalized understanding.. canceled pt apts until then.

## 2018-05-23 ENCOUNTER — Ambulatory Visit (HOSPITAL_COMMUNITY): Payer: Medicare Other

## 2018-05-27 ENCOUNTER — Ambulatory Visit (HOSPITAL_COMMUNITY): Payer: Medicare Other

## 2018-05-29 ENCOUNTER — Ambulatory Visit (HOSPITAL_COMMUNITY): Payer: Medicare Other

## 2018-05-31 ENCOUNTER — Ambulatory Visit (HOSPITAL_COMMUNITY): Payer: Medicare Other

## 2018-06-03 ENCOUNTER — Ambulatory Visit (HOSPITAL_COMMUNITY): Payer: Medicare Other

## 2018-06-05 ENCOUNTER — Ambulatory Visit (HOSPITAL_COMMUNITY): Payer: Medicare Other

## 2018-06-07 ENCOUNTER — Ambulatory Visit (HOSPITAL_COMMUNITY): Payer: Medicare Other

## 2018-06-10 ENCOUNTER — Ambulatory Visit (HOSPITAL_COMMUNITY): Payer: Medicare Other

## 2018-06-12 ENCOUNTER — Ambulatory Visit (HOSPITAL_COMMUNITY): Payer: Medicare Other

## 2018-06-14 ENCOUNTER — Ambulatory Visit (HOSPITAL_COMMUNITY): Payer: Medicare Other

## 2018-06-17 ENCOUNTER — Ambulatory Visit (HOSPITAL_COMMUNITY): Payer: Medicare Other

## 2018-06-19 ENCOUNTER — Ambulatory Visit (HOSPITAL_COMMUNITY): Payer: Medicare Other

## 2018-06-19 ENCOUNTER — Telehealth (HOSPITAL_COMMUNITY): Payer: Self-pay | Admitting: Cardiac Rehabilitation

## 2018-06-19 NOTE — Telephone Encounter (Addendum)
Phone call to patient to discuss outpatient cardiac rehab.  Pt is interested in participating once program reopens from COVID 19 precautions.  Pt is eager to learn lifestyle modifications to reduce risk factors. Pt has not been exercising at home due to knee pain from torn meniscus.  Pt encouraged to wear knee sleeve  and use physical therapy rehab exercises as previously prescribed.   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     Pt does admit to increased emotional stress. Pt encouraged to contact EAP provider and participate in mindfulness, mediation and Yoga practice as healthy stress relievers.  Pt mailed phase I educational packet for reinforcement. Pt is following Covid prevention measures. Pt offered emotional support and reassurance. Pt expressed appreciation for the call. Deveron Furlong, RN, BSN Cardiac Pulmonary Rehab

## 2018-06-21 ENCOUNTER — Ambulatory Visit (HOSPITAL_COMMUNITY): Payer: Medicare Other

## 2018-06-24 ENCOUNTER — Ambulatory Visit (HOSPITAL_COMMUNITY): Payer: Medicare Other

## 2018-06-26 ENCOUNTER — Ambulatory Visit (HOSPITAL_COMMUNITY): Payer: Medicare Other

## 2018-06-28 ENCOUNTER — Ambulatory Visit (HOSPITAL_COMMUNITY): Payer: Medicare Other

## 2018-07-01 ENCOUNTER — Ambulatory Visit (HOSPITAL_COMMUNITY): Payer: Medicare Other

## 2018-07-03 ENCOUNTER — Ambulatory Visit (HOSPITAL_COMMUNITY): Payer: Medicare Other

## 2018-07-05 ENCOUNTER — Ambulatory Visit (HOSPITAL_COMMUNITY): Payer: Medicare Other

## 2018-07-08 ENCOUNTER — Ambulatory Visit (HOSPITAL_COMMUNITY): Payer: Medicare Other

## 2018-07-10 ENCOUNTER — Ambulatory Visit (HOSPITAL_COMMUNITY): Payer: Medicare Other

## 2018-07-12 ENCOUNTER — Ambulatory Visit (HOSPITAL_COMMUNITY): Payer: Medicare Other

## 2018-07-15 ENCOUNTER — Other Ambulatory Visit: Payer: Self-pay

## 2018-07-15 ENCOUNTER — Ambulatory Visit (HOSPITAL_COMMUNITY): Payer: Medicare Other

## 2018-07-15 MED ORDER — ATORVASTATIN CALCIUM 40 MG PO TABS: 40.0000 mg | ORAL_TABLET | Freq: Every day | ORAL | 2 refills | Status: DC

## 2018-07-15 NOTE — Telephone Encounter (Signed)
Rx(s) sent to pharmacy electronically.  

## 2018-07-17 ENCOUNTER — Ambulatory Visit (HOSPITAL_COMMUNITY): Payer: Medicare Other

## 2018-07-19 ENCOUNTER — Ambulatory Visit (HOSPITAL_COMMUNITY): Payer: Medicare Other

## 2018-07-24 ENCOUNTER — Ambulatory Visit (HOSPITAL_COMMUNITY): Payer: Medicare Other

## 2018-07-26 ENCOUNTER — Ambulatory Visit (HOSPITAL_COMMUNITY): Payer: Medicare Other

## 2018-07-29 ENCOUNTER — Ambulatory Visit (HOSPITAL_COMMUNITY): Payer: Medicare Other

## 2018-07-31 ENCOUNTER — Telehealth (HOSPITAL_COMMUNITY): Payer: Self-pay

## 2018-07-31 ENCOUNTER — Encounter (HOSPITAL_COMMUNITY)
Admission: RE | Admit: 2018-07-31 | Discharge: 2018-07-31 | Disposition: A | Discharge status: Home / Self Care | Payer: Self-pay | Source: Ambulatory Visit | Attending: Cardiovascular Disease | Admitting: Cardiovascular Disease

## 2018-07-31 ENCOUNTER — Ambulatory Visit (HOSPITAL_COMMUNITY): Payer: Medicare Other

## 2018-07-31 NOTE — Telephone Encounter (Signed)
Called and spoke to pt regarding Virtual Cardiac Rehab.  Pt  was able to download the [facility] [employer] app on their smart device with no issues. Pt set up their account and received the following welcome message -"Welcome to the [facility] program. We hope that you will find the exercise program beneficial in your recovery process. Our staff is available to assist with any questions/concerns about your exercise routine. Best wishes". Brief orientation provided to with the advisement to watch the "Intro to Rehab" series located under the Resource tab. Pt verbalized understanding. Will continue to follow and monitor pt progress with feedback as needed. 

## 2018-07-31 NOTE — Telephone Encounter (Signed)
         Confirm Consent - In the setting of the current Covid19 crisis, you are scheduled for a phone visit with your Cardiac or Pulmonary team member.  Just as we do with many in-gym visits, in order for you to participate in this visit, we must obtain consent.  If you'd like, I can send this to your mychart (if signed up) or email for you to review.  Otherwise, I can obtain your verbal consent now.  By agreeing to a telephone visit, we'd like you to understand that the technology does not allow for your Cardiac or Pulmonary Rehab team member to perform a physical assessment, and thus may limit their ability to fully assess your ability to perform exercise programs. If your provider identifies any concerns that need to be evaluated in person, we will make arrangements to do so.  Finally, though the technology is pretty good, we cannot assure that it will always work on either your or our end and we cannot ensure that we have a secure connection.  Cardiac and Pulmonary Rehab Telehealth visits and "At Home" cardiac and pulmonary rehab are provided at no cost to you.               Are you willing to proceed?" STAFF: Did the patient verbally acknowledge consent to telehealth visit? Document YES/NO here: YES      Prentice Docker BS, ACSM CEP   Cardiac and Pulmonary Rehab Staff   07/31/2018 2:25 PM

## 2018-07-31 NOTE — Telephone Encounter (Signed)
Phone call made to Pt to provide information about virtual cardiac rehab. Pt was responsive and wanted to move forward with virtual rehab. Pt was informed she would receive a text message with instructions on how to set up the application.  

## 2018-08-02 ENCOUNTER — Ambulatory Visit (HOSPITAL_COMMUNITY): Payer: Medicare Other

## 2018-08-05 ENCOUNTER — Ambulatory Visit (HOSPITAL_COMMUNITY): Payer: Medicare Other

## 2018-08-07 ENCOUNTER — Ambulatory Visit (HOSPITAL_COMMUNITY): Payer: Medicare Other

## 2018-08-09 ENCOUNTER — Ambulatory Visit (HOSPITAL_COMMUNITY): Payer: Medicare Other

## 2018-08-12 ENCOUNTER — Ambulatory Visit (HOSPITAL_COMMUNITY): Payer: Medicare Other

## 2018-08-14 ENCOUNTER — Ambulatory Visit (HOSPITAL_COMMUNITY): Payer: Medicare Other

## 2018-08-15 ENCOUNTER — Telehealth (HOSPITAL_COMMUNITY): Payer: Self-pay

## 2018-08-16 ENCOUNTER — Ambulatory Visit (HOSPITAL_COMMUNITY): Payer: Medicare Other

## 2018-08-19 ENCOUNTER — Ambulatory Visit (HOSPITAL_COMMUNITY): Payer: Medicare Other

## 2018-08-21 ENCOUNTER — Ambulatory Visit (HOSPITAL_COMMUNITY): Payer: Medicare Other

## 2018-08-23 ENCOUNTER — Ambulatory Visit (HOSPITAL_COMMUNITY): Payer: Medicare Other

## 2018-08-26 ENCOUNTER — Ambulatory Visit (HOSPITAL_COMMUNITY): Payer: Medicare Other

## 2018-08-28 ENCOUNTER — Ambulatory Visit (HOSPITAL_COMMUNITY): Payer: Medicare Other

## 2019-03-28 ENCOUNTER — Ambulatory Visit: Payer: Medicare Other

## 2019-04-05 ENCOUNTER — Ambulatory Visit: Payer: Medicare Other | Attending: Internal Medicine

## 2019-04-05 DIAGNOSIS — Z23 Encounter for immunization: Secondary | ICD-10-CM

## 2019-04-05 NOTE — Progress Notes (Signed)
   Covid-19 Vaccination Clinic  Name:  LAINY WROBLESKI    MRN: [id number] DOB: Apr 11, 1945  04/05/2019  Ms. Barbara was observed post Covid-19 immunization for 15 minutes without incidence. She was provided with Vaccine Information Sheet and instruction to access the V-Safe system.   Ms. Standen was instructed to call 911 with any severe reactions post vaccine: Marland Kitchen Difficulty breathing  . Swelling of your face and throat  . A fast heartbeat  . A bad rash all over your body  . Dizziness and weakness    Immunizations Administered    Name Date Dose VIS Date Route   Pfizer COVID-19 Vaccine 04/05/2019  4:07 PM 0.3 mL 02/07/2019 Intramuscular   Manufacturer: [employer], [employer]   Lot: [id number]   NDC: [address]-0

## 2019-04-14 ENCOUNTER — Ambulatory Visit: Payer: Medicare Other

## 2019-04-30 ENCOUNTER — Ambulatory Visit: Payer: Medicare Other | Attending: Internal Medicine

## 2019-04-30 DIAGNOSIS — Z23 Encounter for immunization: Secondary | ICD-10-CM

## 2019-04-30 NOTE — Progress Notes (Signed)
   Covid-19 Vaccination Clinic  Name:  KRYSTOL ROCCO    MRN: [id number] DOB: 09-Aug-1945  04/30/2019  Ms. Kilcrease was observed post Covid-19 immunization for 15 minutes without incident. She was provided with Vaccine Information Sheet and instruction to access the V-Safe system.   Ms. Afzal was instructed to call 911 with any severe reactions post vaccine: Marland Kitchen Difficulty breathing  . Swelling of face and throat  . A fast heartbeat  . A bad rash all over body  . Dizziness and weakness   Immunizations Administered    Name Date Dose VIS Date Route   Pfizer COVID-19 Vaccine 04/30/2019  2:22 PM 0.3 mL 02/07/2019 Intramuscular   Manufacturer: [employer], [employer]   Lot: [id number]   NDC: [address]-7

## 2019-06-16 NOTE — Progress Notes (Signed)
Virtual Visit via Telephone Note   This visit type was conducted due to national recommendations for restrictions regarding the COVID-19 Pandemic (e.g. social distancing) in an effort to limit this patient's exposure and mitigate transmission in our community.  Due to her co-morbid illnesses, this patient is at least at moderate risk for complications without adequate follow up.  This format is felt to be most appropriate for this patient at this time.  The patient did not have access to video technology/had technical difficulties with video requiring transitioning to audio format only (telephone).  All issues noted in this document were discussed and addressed.  No physical exam could be performed with this format.  Please refer to the patient's chart for her  consent to telehealth for [facility].   Date:  06/16/2019   ID:  Meagan Stout, DOB Jun 24, 1945, MRN [id number]  Patient Location: Home Provider Location: Home  PCP:  Leeroy Cha, MD  Cardiologist:  Skeet Latch, MD  Electrophysiologist:  None   Evaluation Performed:  Follow-Up Visit  Chief Complaint:  Follow up  History of Present Illness:    Meagan Stout is a 74 y.o. female we are following for ongoing assessment and management of chest pain, with admission in 2020 for same. Cardiac cath revealed non-obstructive CAD, LV function mildly reduced, EF 45%, with apical hypokinesis suggestive of stress induced CM. BB was discontinued due to hypotension. She did not tolerate ACE or ARB. She was also started on Lipitor 40 mg due to elevated LDL 101.   Other history includes anxiety, hiatal hernia, GERD. Last seen in the office on 04/24/2018 with multiple questions about her prognosis. She was advised to eliminate caffeine and any other stimulants which can cause increased HR. She was to follow up with GI for GERD symptoms. She is to have follow up fasting lipids and LFT's.   She continues to have significant anxiety with  her daugther-in-law very ill. She is having some depression which she admits to me in this setting an in [place] restrictions. She is normally very social and active but the pandemic and restricted this for her. She is doing her best to adjust.   She offers no complaints of dizziness, near syncope, significant palpitations. BP has continued to be soft.   The patient does not have symptoms concerning for COVID-19 infection (fever, chills, cough, or new shortness of breath).    Past Medical History:  Diagnosis Date  . Anxiety   . GERD (gastroesophageal reflux disease)   . Non-Obstructive CAD    a. LHC 04/12/2018: 30% mRCA, 20% 1st Mrg, 20% pLAD. LVEF 45-50% w/ apical hypokinesis suggestive of stress-induced cardiomyopathy.   Past Surgical History:  Procedure Laterality Date  . HERNIA REPAIR    . LEFT HEART CATH AND CORONARY ANGIOGRAPHY N/A 04/12/2018   Procedure: LEFT HEART CATH AND CORONARY ANGIOGRAPHY;  Surgeon: Wellington Hampshire, MD;  Location: [place] [facility];  Service: Cardiovascular;  Laterality: N/A;     No outpatient medications have been marked as taking for the 06/17/19 encounter (Appointment) with Lendon Colonel, NP.     Allergies:   Patient has no known allergies.   Social History   Tobacco Use  . Smoking status: Never Smoker  . Smokeless tobacco: Never Used  Substance Use Topics  . Alcohol use: Yes    Comment: occ  . Drug use: Not on file     Family Hx: The patient's family history includes CAD in her father.  ROS:   Please see the history of present illness.    All other systems reviewed and are negative.   Prior CV studies:   The following studies were reviewed today: Left Heart Catheterization 04/12/2018:  Mid RCA lesion is 30% stenosed.  There is mild left ventricular systolic dysfunction.  LV end diastolic pressure is normal.  The left ventricular ejection fraction is 45-50% by visual estimate.  1st Mrg lesion is 20% stenosed.  Prox LAD  lesion is 20% stenosed.  1. Mild nonobstructive coronary artery disease. 2. Mildly reduced LV systolic function with an EF of 45% with apical hypokinesis suggestive of stress-induced cardiomyopathy. 3. High normal left ventricular end-diastolic pressure.  Recommendations: I do not see any culprit for non-ST elevation myocardial infarction. Wall motion abnormalities suggestive of stress-induced cardiomyopathy. Recommend medical therapy. Obtain an echocardiogram.  Echocardiogram 04/12/2018: Impressions: 1. The left ventricle has normal systolic function, with an ejection fraction of 55-60%. The cavity size was normal. Left ventricular diastolic Doppler parameters are consistent with impaired relaxation. 2. The right ventricle has normal systolic function. The cavity was normal. There is no increase in right ventricular wall thickness. 3. Left atrial size was mildly dilated. 4. The mitral valve is normal in structure. 5. The tricuspid valve is normal in structure. 6. The aortic valve is normal in structure. 7. The pulmonic valve was normal in structure. 8. The inferior vena cava was dilated in size with >50% respiratory variability. 9. Right atrial pressure is estimated at 8 mmHg.   Labs/Other Tests and Data Reviewed:    EKG:  No ECG reviewed.  Recent Labs: No results found for requested labs within last 8760 hours.   Recent Lipid Panel Lab Results  Component Value Date/Time   CHOL 160 04/12/2018 12:45 AM   TRIG 59 04/12/2018 12:45 AM   HDL 47 04/12/2018 12:45 AM   CHOLHDL 3.4 04/12/2018 12:45 AM   LDLCALC 101 (H) 04/12/2018 12:45 AM    Wt Readings from Last 3 Encounters:  04/24/18 192 lb (87.1 kg)  04/11/18 195 lb 4.8 oz (88.6 kg)     Objective:    Vital Signs:  There were no vitals taken for this visit.   VITAL SIGNS:  reviewed GEN:  no acute distress RESPIRATORY:  normal respiratory effort, symmetric expansion NEURO:  alert and oriented x 3, no  obvious focal deficit PSYCH:  normal affect  ASSESSMENT & PLAN:    1. Palpitations: Improved but can be exacerbated by stress and anxiety, along with caffeine. She is advised to remove caffeine from her diet as she appears to be very sensitive to this.   2. Systolic Dysfunction: Reduced EF in 2020, with cath revealing non-obstructive CAD.  No complaints of swelling or DOE. No further testing unless she is symptomatic. Cannot tolerate BB/ARB/ACE/ARNI due to soft BP  3. Hyperlipidemia: Continue statin therapy. She is having labs followed by PCP.  4. Depression and Anxiety:Followed by PCP  COVID-19 Education: The signs and symptoms of COVID-19 were discussed with the patient and how to seek care for testing (follow up with PCP or arrange E-visit).  The importance of social distancing was discussed today.  Time:   Today, I have spent 20 minutes with the patient with telehealth technology discussing the above problems.     Medication Adjustments/Labs and Tests Ordered: Current medicines are reviewed at length with the patient today.  Concerns regarding medicines are outlined above.   Tests Ordered: No orders of the defined types were placed in this  encounter.   Medication Changes: No orders of the defined types were placed in this encounter.   Disposition:  Follow up 6-8 months  Signed, Bettey Mare. Liborio Nixon, ANP, [facility]  06/16/2019 7:44 AM    [facility]

## 2019-06-17 ENCOUNTER — Telehealth (INDEPENDENT_AMBULATORY_CARE_PROVIDER_SITE_OTHER): Payer: Medicare Other | Admitting: Adult Health

## 2019-06-17 ENCOUNTER — Encounter: Payer: Self-pay | Admitting: Adult Health

## 2019-06-17 VITALS — BP 105/74 | HR 84 | Ht 69.0 in | Wt 192.0 lb

## 2019-06-17 DIAGNOSIS — E7800 Pure hypercholesterolemia, unspecified: Secondary | ICD-10-CM

## 2019-06-17 DIAGNOSIS — R002 Palpitations: Secondary | ICD-10-CM

## 2019-06-17 DIAGNOSIS — F419 Anxiety disorder, unspecified: Secondary | ICD-10-CM

## 2019-06-17 DIAGNOSIS — F32A Depression, unspecified: Secondary | ICD-10-CM

## 2019-06-17 DIAGNOSIS — F329 Major depressive disorder, single episode, unspecified: Secondary | ICD-10-CM | POA: Diagnosis not present

## 2019-06-17 NOTE — Patient Instructions (Signed)
Medication Instructions:  Continue current medications  *If you need a refill on your cardiac medications before your next appointment, please call your pharmacy*   Lab Work: None Ordered   Testing/Procedures: None Ordered   Follow-Up: At [facility], you and your health needs are our priority.  As part of our continuing mission to provide you with exceptional heart care, we have created designated Provider Care Teams.  These Care Teams include your primary Cardiologist (physician) and Advanced Practice Providers (APPs -  Physician Assistants and Nurse Practitioners) who all work together to provide you with the care you need, when you need it.  We recommend signing up for the patient portal called "MyChart".  Sign up information is provided on this After Visit Summary.  MyChart is used to connect with patients for Virtual Visits (Telemedicine).  Patients are able to view lab/test results, encounter notes, upcoming appointments, etc.  Non-urgent messages can be sent to your provider as well.   To learn more about what you can do with MyChart, go to [email or web address].    Your next appointment:   1 year(s)  The format for your next appointment:   In Person  Provider:   You may see Tiffany Westboro, MD or one of the following Advanced Practice Providers on your designated Care Team:    Luke Kilroy, PA-C  Callie Goodrich, PA-C  Jesse Cleaver, FNP    

## 2019-12-13 ENCOUNTER — Ambulatory Visit: Payer: Medicare Other | Attending: Internal Medicine

## 2019-12-13 DIAGNOSIS — Z23 Encounter for immunization: Secondary | ICD-10-CM

## 2019-12-13 NOTE — Progress Notes (Signed)
   Covid-19 Vaccination Clinic  Name:  Meagan Stout    MRN: [id number] DOB: January 22, 1946  12/13/2019  Meagan Stout was observed post Covid-19 immunization for 15 minutes without incident. She was provided with Vaccine Information Sheet and instruction to access the V-Safe system.   Meagan Stout was instructed to call 911 with any severe reactions post vaccine: Marland Kitchen Difficulty breathing  . Swelling of face and throat  . A fast heartbeat  . A bad rash all over body  . Dizziness and weakness

## 2020-06-16 ENCOUNTER — Telehealth: Payer: Self-pay | Admitting: Cardiovascular Disease

## 2020-06-16 NOTE — Telephone Encounter (Signed)
4.20.22 LVM on cell to schedule 1 yr fu wDr Duke Salvia or APP.Alinda Dooms

## 2020-07-19 ENCOUNTER — Other Ambulatory Visit: Payer: Self-pay | Admitting: Physician Assistant

## 2020-07-19 DIAGNOSIS — K219 Gastro-esophageal reflux disease without esophagitis: Secondary | ICD-10-CM

## 2020-07-20 ENCOUNTER — Other Ambulatory Visit: Payer: Self-pay | Admitting: Physician Assistant

## 2020-07-20 ENCOUNTER — Ambulatory Visit
Admission: RE | Admit: 2020-07-20 | Discharge: 2020-07-20 | Disposition: A | Discharge status: Home / Self Care | Payer: Medicare Other | Source: Ambulatory Visit | Attending: Physician Assistant | Admitting: Physician Assistant

## 2020-07-20 DIAGNOSIS — R197 Diarrhea, unspecified: Secondary | ICD-10-CM

## 2020-07-20 DIAGNOSIS — R1030 Lower abdominal pain, unspecified: Secondary | ICD-10-CM

## 2020-07-20 DIAGNOSIS — K219 Gastro-esophageal reflux disease without esophagitis: Secondary | ICD-10-CM

## 2020-07-23 ENCOUNTER — Ambulatory Visit: Payer: Medicare Other | Admitting: Cardiovascular Disease

## 2020-07-23 NOTE — Progress Notes (Incomplete)
Cardiology Office Note:    Date:  07/23/2020   ID:  Meagan Stout, DOB 05-06-1945, MRN [id number]  PCP:  Lorenda Ishihara, MD   [facility] Providers Cardiologist:  Chilton Si, MD { Click to update primary MD,subspecialty MD or APP then REFRESH:1}    Referring MD: Lorenda Ishihara,*   No chief complaint on file. ***  History of Present Illness:    Meagan Stout is a 75 y.o. female with a hx of anxiety, GERD, and non-obstructive CAD*** here for follow-up. She was last seen by me in the ED 04/12/2018.  Today,  She denies any chest pain, shortness of breath, palpitations, or exertional symptoms. No headaches, lightheadedness, or syncope to report. Also has no lower extremity edema, orthopnea or PND.   Past Medical History:  Diagnosis Date   Anxiety    GERD (gastroesophageal reflux disease)    Non-Obstructive CAD    a. LHC 04/12/2018: 30% mRCA, 20% 1st Mrg, 20% pLAD. LVEF 45-50% w/ apical hypokinesis suggestive of stress-induced cardiomyopathy.    Past Surgical History:  Procedure Laterality Date   HERNIA REPAIR     LEFT HEART CATH AND CORONARY ANGIOGRAPHY N/A 04/12/2018   Procedure: LEFT HEART CATH AND CORONARY ANGIOGRAPHY;  Surgeon: Iran Ouch, MD;  [facility];  Service: Cardiovascular;  Laterality: N/A;    Current Medications: No outpatient medications have been marked as taking for the 07/23/20 encounter (Appointment) with Chilton Si, MD.     Allergies:   Patient has no known allergies.   Social History   Socioeconomic History   Marital status: Married    Spouse name: Not on file   Number of children: Not on file   Years of education: Not on file   Highest education level: Not on file  Occupational History   Not on file  Tobacco Use   Smoking status: Never Smoker   Smokeless tobacco: Never Used  Substance and Sexual Activity   Alcohol use: Yes    Comment: occ   Drug use: Not on file   Sexual  activity: Not on file  Other Topics Concern   Not on file  Social History Narrative   Not on file   Social Determinants of Health   Financial Resource Strain: Not on file  Food Insecurity: Not on file  Transportation Needs: Not on file  Physical Activity: Not on file  Stress: Not on file  Social Connections: Not on file     Family History: The patient's family history includes CAD in her father.  ROS:   Please see the history of present illness.    (+) All other systems reviewed and are negative.  EKGs/Labs/Other Studies Reviewed:    The following studies were reviewed today: Echo 04/12/2018: 1. The left ventricle has normal systolic function, with an ejection  fraction of 55-60%. The cavity size was normal. Left ventricular diastolic  Doppler parameters are consistent with impaired relaxation.  2. The right ventricle has normal systolic function. The cavity was  normal. There is no increase in right ventricular wall thickness.  3. Left atrial size was mildly dilated.  4. The mitral valve is normal in structure.  5. The tricuspid valve is normal in structure.  6. The aortic valve is normal in structure.  7. The pulmonic valve was normal in structure.  8. The inferior vena cava was dilated in size with >50% respiratory  variability.  9. Right atrial pressure is estimated at 8 mmHg.  LHC 04/12/2018:  Mid RCA lesion is 30% stenosed.  There is mild left ventricular systolic dysfunction.  LV end diastolic pressure is normal.  The left ventricular ejection fraction is 45-50% by visual estimate.  1st Mrg lesion is 20% stenosed.  Prox LAD lesion is 20% stenosed.   1.  Mild nonobstructive coronary artery disease. 2.  Mildly reduced LV systolic function with an EF of 45% with apical hypokinesis suggestive of stress-induced cardiomyopathy. 3.  High normal left ventricular end-diastolic pressure.  Recommendations: I do not see any culprit for non-ST elevation  myocardial infarction.  Wall motion abnormalities suggestive of stress-induced cardiomyopathy.  Recommend medical therapy.  Obtain an echocardiogram.  EKG:   07/23/2020: ***  Recent Labs: No results found for requested labs within last 8760 hours.  Recent Lipid Panel    Component Value Date/Time   CHOL 160 04/12/2018 0045   TRIG 59 04/12/2018 0045   HDL 47 04/12/2018 0045   CHOLHDL 3.4 04/12/2018 0045   VLDL 12 04/12/2018 0045   LDLCALC 101 (H) 04/12/2018 0045     Risk Assessment/Calculations:   {Does this patient have ATRIAL FIBRILLATION?[phone]}   Physical Exam:    VS:  There were no vitals taken for this visit.    Wt Readings from Last 3 Encounters:  06/17/19 192 lb (87.1 kg)  04/24/18 192 lb (87.1 kg)  04/11/18 195 lb 4.8 oz (88.6 kg)     GEN: Well nourished, well developed in no acute distress HEENT: Normal NECK: No JVD; No carotid bruits LYMPHATICS: No lymphadenopathy CARDIAC: RRR, no murmurs, rubs, gallops RESPIRATORY:  Clear to auscultation without rales, wheezing or rhonchi  ABDOMEN: Soft, non-tender, non-distended MUSCULOSKELETAL:  No edema; No deformity  SKIN: Warm and dry NEUROLOGIC:  Alert and oriented x 3 PSYCHIATRIC:  Normal affect   ASSESSMENT:    No diagnosis found. PLAN:   No problem-specific Assessment & Plan notes found for this encounter.   In order of problems listed above:  1. ***   {Are you ordering a CV Procedure (e.g. stress test, cath, DCCV, TEE, etc)?   Press F2        :[id number]}   Disposition: FU with Tiffany C. Duke Salvia, MD, [facility] in *** months.   Medication Adjustments/Labs and Tests Ordered: Current medicines are reviewed at length with the patient today.  Concerns regarding medicines are outlined above.  No orders of the defined types were placed in this encounter.  No orders of the defined types were placed in this encounter.   There are no Patient Instructions on file for this visit.   I,Mathew Stumpf,acting  as a Neurosurgeon for Chilton Si, MD.,have documented all relevant documentation on the behalf of Chilton Si, MD,as directed by  Chilton Si, MD while in the presence of Chilton Si, MD.  ***  Signed, Carlena Bjornstad  07/23/2020 7:56 AM    [facility]

## 2020-07-30 ENCOUNTER — Encounter: Payer: Self-pay | Admitting: Cardiovascular Disease

## 2020-07-30 ENCOUNTER — Other Ambulatory Visit: Payer: Self-pay

## 2020-07-30 ENCOUNTER — Ambulatory Visit (INDEPENDENT_AMBULATORY_CARE_PROVIDER_SITE_OTHER): Payer: Medicare Other | Admitting: Cardiovascular Disease

## 2020-07-30 DIAGNOSIS — I5042 Chronic combined systolic (congestive) and diastolic (congestive) heart failure: Secondary | ICD-10-CM | POA: Diagnosis not present

## 2020-07-30 DIAGNOSIS — E669 Obesity, unspecified: Secondary | ICD-10-CM | POA: Insufficient documentation

## 2020-07-30 DIAGNOSIS — I2510 Atherosclerotic heart disease of native coronary artery without angina pectoris: Secondary | ICD-10-CM

## 2020-07-30 DIAGNOSIS — R002 Palpitations: Secondary | ICD-10-CM

## 2020-07-30 DIAGNOSIS — E66811 Obesity, class 1: Secondary | ICD-10-CM | POA: Insufficient documentation

## 2020-07-30 HISTORY — DX: Palpitations: R00.2

## 2020-07-30 HISTORY — DX: Atherosclerotic heart disease of native coronary artery without angina pectoris: I25.10

## 2020-07-30 HISTORY — DX: Obesity, class 1: E66.811

## 2020-07-30 HISTORY — DX: Obesity, unspecified: E66.9

## 2020-07-30 NOTE — Patient Instructions (Addendum)
Medication Instructions:  Your physician recommends that you continue on your current medications as directed. Please refer to the Current Medication list given to you today.  *If you need a refill on your cardiac medications before your next appointment, please call your pharmacy*  Lab Work: NONE   Testing/Procedures: Your physician has requested that you have an echocardiogram. Echocardiography is a painless test that uses sound waves to create images of your heart. It provides your doctor with information about the size and shape of your heart and how well your heart's chambers and valves are working. This procedure takes approximately one hour. There are no restrictions for this procedure. [facility] AT [address]   Follow-Up: At [facility], you and your health needs are our priority.  As part of our continuing mission to provide you with exceptional heart care, we have created designated Provider Care Teams.  These Care Teams include your primary Cardiologist (physician) and Advanced Practice Providers (APPs -  Physician Assistants and Nurse Practitioners) who all work together to provide you with the care you need, when you need it.  We recommend signing up for the patient portal called "MyChart".  Sign up information is provided on this After Visit Summary.  MyChart is used to connect with patients for Virtual Visits (Telemedicine).  Patients are able to view lab/test results, encounter notes, upcoming appointments, etc.  Non-urgent messages can be sent to your provider as well.   To learn more about what you can do with MyChart, go to [email or web address].    Your next appointment:   4 month(s)  The format for your next appointment:   In Person  Provider:   DR [facility] OR NP AT [facility] LOCATION   Other Instructions  SOMEONE FROM PREP PROGRAM ([facility])  WILL BE IN [facility]   Consider getting the Kardia mobile device by [employer] to track your  palpitations.

## 2020-07-30 NOTE — Progress Notes (Signed)
Cardiology Office Note:    Date:  07/30/2020   ID:  Meagan BisonJane E Kissoon, DOB 08/09/1945, MRN [id number]  PCP:  Lorenda IshiharaVaradarajan, Rupashree, MD   [facility] Providers Cardiologist:  Chilton Siiffany , MD     Referring MD: Lorenda IshiharaVaradarajan, Rupashree,*   No chief complaint on file.   History of Present Illness:    Meagan Stout is a 75 y.o. female with a hx of GERD, hiatal hernia s/p repair, anxiety, prior tobacco abuse, and Non-Obstructive CAD who presents today for follow-up. I last saw her in the ED on 04/12/2018. She was admitted 04/11/2018 with chest pain and elevated cardiac enzymes. She had an Echo 04/12/2018 that found normal systolic function and an ejection fraction of 55-60%. A left heart catheterization was also performed and showed LVEF 45-50% with apical hypokinesis, suggestive of stress-induced cardiomyopathy. She had mild non-obstructive CAD. She followed up with Bailey MechKatherine Lawrence most recently 05/2019 and was doing well. Beta-blocker was discontinued due to hypotension. She does not tolerate ace inhibitors or ARBs. At her last visit she was struggling with anxiety due to her daughter-in-law being ill.  Today, she reports that every once in a while she will feel her heart racing. The palpitations will last up to an hour. She noticed this yesterday while she was hungry and feeling shaky. The palpitations have been occurring considerably less lately, and she notes she is retired for about 6 months now. She also has episodic chest discomfort that she describes as an ache, but this does not occur often. For her diet she continues to try to eat healthier but notes her weight does not necessarily reflect this. Overall, she gets very little exercise. She enjoys walking but she states she just isn't motivated enough. She denies any shortness of breath, or exertional symptoms. No headaches, lightheadedness, or syncope to report. Also has no lower extremity edema, orthopnea or PND.   Past Medical History:   Diagnosis Date  . Anxiety   . CAD in native artery 07/30/2020  . Chronic combined systolic and diastolic heart failure (HCC) 04/12/2018  . GERD (gastroesophageal reflux disease)   . Non-Obstructive CAD    a. LHC 04/12/2018: 30% mRCA, 20% 1st Mrg, 20% pLAD. LVEF 45-50% w/ apical hypokinesis suggestive of stress-induced cardiomyopathy.  . Palpitations 07/30/2020    Past Surgical History:  Procedure Laterality Date  . HERNIA REPAIR    . LEFT HEART CATH AND CORONARY ANGIOGRAPHY N/A 04/12/2018   Procedure: LEFT HEART CATH AND CORONARY ANGIOGRAPHY;  Surgeon: Iran OuchArida, Muhammad A, MD;  [facility];  Service: Cardiovascular;  Laterality: N/A;    Current Medications: Current Meds  Medication Sig  . atorvastatin (LIPITOR) 40 MG tablet Take 1 tablet (40 mg total) by mouth daily at 6 PM.  . calcium carbonate (TUMS - DOSED IN MG ELEMENTAL CALCIUM) 500 MG chewable tablet Chew 1-2 tablets by mouth as needed for indigestion or heartburn.  . Cholecalciferol (VITAMIN D-3 PO) Take 1 capsule by mouth 2 (two) times daily.  . fluticasone (FLONASE) 50 MCG/ACT nasal spray Place 2 sprays into both nostrils daily.  Marland Kitchen. glycopyrrolate (ROBINUL) 1 MG tablet Take 1 mg by mouth daily as needed (for IBS symptoms).   Marland Kitchen. ibuprofen (ADVIL,MOTRIN) 200 MG tablet Take 400-600 mg by mouth every 6 (six) hours as needed (for pain or inflammation).  Marland Kitchen. loperamide (IMODIUM A-D) 2 MG tablet Take 2 mg by mouth 4 (four) times daily as needed for diarrhea or loose stools.  . nitroGLYCERIN (NITROSTAT) 0.4 MG  SL tablet Place 1 tablet (0.4 mg total) under the tongue every 5 (five) minutes x 3 doses as needed for chest pain.  . pantoprazole (PROTONIX) 40 MG tablet Take 40 mg by mouth 2 (two) times daily.  Marland Kitchen PARoxetine (PAXIL) 20 MG tablet Take 20 mg by mouth at bedtime.     Allergies:   Patient has no known allergies.   Social History   Socioeconomic History  . Marital status: Married    Spouse name: Not on file  . Number  of children: Not on file  . Years of education: Not on file  . Highest education level: Not on file  Occupational History  . Not on file  Tobacco Use  . Smoking status: Never Smoker  . Smokeless tobacco: Never Used  Substance and Sexual Activity  . Alcohol use: Yes    Comment: occ  . Drug use: Not on file  . Sexual activity: Not on file  Other Topics Concern  . Not on file  Social History Narrative  . Not on file   Social Determinants of Health   Financial Resource Strain: Not on file  Food Insecurity: Not on file  Transportation Needs: Not on file  Physical Activity: Not on file  Stress: Not on file  Social Connections: Not on file     Family History: The patient's family history includes CAD in her father.  ROS:   Please see the history of present illness.    (+) Palpitations (+) Chest discomfort/ache All other systems reviewed and are negative.  EKGs/Labs/Other Studies Reviewed:    The following studies were reviewed today:  LHC 04/12/18:  Mid RCA lesion is 30% stenosed.  There is mild left ventricular systolic dysfunction.  LV end diastolic pressure is normal.  The left ventricular ejection fraction is 45-50% by visual estimate.  1st Mrg lesion is 20% stenosed.  Prox LAD lesion is 20% stenosed.  1. Mild nonobstructive coronary artery disease. 2. Mildly reduced LV systolic function with an EF of 45% with apical hypokinesis suggestive of stress-induced cardiomyopathy. 3. High normal left ventricular end-diastolic pressure.  Recommendations: I do not see any culprit for non-ST elevation myocardial infarction. Wall motion abnormalities suggestive of stress-induced cardiomyopathy. Recommend medical therapy. Obtain an echocardiogram.  EKG:   07/30/2020: Sinus rhythm, Rate 65 bpm. LAFB 04/12/2018 (ED): Sinus rhythm, Rate 65 bpm. Pronounced T wave inversions anterolaterally and inferior.  Recent Labs: No results found for requested labs within last  8760 hours.   Recent Lipid Panel    Component Value Date/Time   CHOL 160 04/12/2018 0045   TRIG 59 04/12/2018 0045   HDL 47 04/12/2018 0045   CHOLHDL 3.4 04/12/2018 0045   VLDL 12 04/12/2018 0045   LDLCALC 101 (H) 04/12/2018 0045     Physical Exam:    VS:  BP 116/80 (BP Location: Left Arm, Patient Position: Sitting, Cuff Size: Normal)   Pulse 65   Resp 18   Ht 5\' 6"  (1.676 m)   Wt 198 lb (89.8 kg)   SpO2 98%   BMI 31.96 kg/m     Wt Readings from Last 3 Encounters:  07/30/20 198 lb (89.8 kg)  06/17/19 192 lb (87.1 kg)  04/24/18 192 lb (87.1 kg)     GEN: Well nourished, well developed in no acute distress HEENT: Normal NECK: No JVD; No carotid bruits CARDIAC: RRR, no murmurs, rubs, gallops RESPIRATORY:  Clear to auscultation without rales, wheezing or rhonchi  ABDOMEN: Soft, non-tender, non-distended MUSCULOSKELETAL:  No  edema; No deformity  SKIN: Warm and dry NEUROLOGIC:  Alert and oriented x 3 PSYCHIATRIC:  Normal affect   ASSESSMENT:    1. CAD in native artery   2. Chronic combined systolic and diastolic heart failure (HCC)   3. Palpitations    PLAN:   CAD in native artery Non-obstrucive CAD at the time of her cath 03/2018.  She does have some chest pressure that is not exertional.  I suspect is more related to her hiatal hernia.  She is going to start exercising with the PREP program through the [facility].  If she does develop any exertional symptoms we will pursue a repeat ischemia evaluation.  For now, continue atorvastatin.  Chronic combined systolic and diastolic heart failure (HCC) Ms. Holten episode of acute systolic and diastolic heart failure that is thought to be due to stress related cardiomyopathy.  She had nonobstructive disease at cath.  She is euvolemic and doing well.  She has not had a repeat assessment of her systolic function since then.  She has been unable to tolerate guideline directed medical therapy due to hypotension.  We will repeat her  echocardiogram.  Palpitations She has sporadic episodes of palpitations where her heart rate goes as high as the 140s.  It takes several minutes for her to dissipate.  It is not happening frequently enough for her to wear an ambulatory monitor.  We discussed her getting the [facility] device to use when she is having symptoms.    Medication Adjustments/Labs and Tests Ordered: Current medicines are reviewed at length with the patient today.  Concerns regarding medicines are outlined above.  Orders Placed This Encounter  Procedures  . EKG 12-Lead  . ECHOCARDIOGRAM COMPLETE   No orders of the defined types were placed in this encounter.   Patient Instructions  Medication Instructions:  Your physician recommends that you continue on your current medications as directed. Please refer to the Current Medication list given to you today.  *If you need a refill on your cardiac medications before your next appointment, please call your pharmacy*  Lab Work: NONE   Testing/Procedures: Your physician has requested that you have an echocardiogram. Echocardiography is a painless test that uses sound waves to create images of your heart. It provides your doctor with information about the size and shape of your heart and how well your heart's chambers and valves are working. This procedure takes approximately one hour. There are no restrictions for this procedure. [facility] AT [address]   Follow-Up: At [facility], you and your health needs are our priority.  As part of our continuing mission to provide you with exceptional heart care, we have created designated Provider Care Teams.  These Care Teams include your primary Cardiologist (physician) and Advanced Practice Providers (APPs -  Physician Assistants and Nurse Practitioners) who all work together to provide you with the care you need, when you need it.  We recommend signing up for the patient portal called  "MyChart".  Sign up information is provided on this After Visit Summary.  MyChart is used to connect with patients for Virtual Visits (Telemedicine).  Patients are able to view lab/test results, encounter notes, upcoming appointments, etc.  Non-urgent messages can be sent to your provider as well.   To learn more about what you can do with MyChart, go to [email or web address].    Your next appointment:   4 month(s)  The format for your next appointment:   In  Person  Provider:   DR Duke Salvia OR NP AT [facility] LOCATION   Other Instructions  SOMEONE FROM PREP PROGRAM ([facility])  WILL BE IN [facility]   Consider getting the Kardia mobile device by [employer] to track your palpitations.    Disposition: Follow-up with Aili Casillas C. Duke Salvia, MD, [facility] in 4 months.  I,Mathew Stumpf,acting as a Neurosurgeon for Chilton Si, MD.,have documented all relevant documentation on the behalf of Chilton Si, MD,as directed by  Chilton Si, MD while in the presence of Chilton Si, MD.  I, Justin Meisenheimer C. Duke Salvia, MD have reviewed all documentation for this visit.  The documentation of the exam, diagnosis, procedures, and orders on 07/30/2020 are all accurate and complete.   Signed, Chilton Si, MD  07/30/2020 3:45 PM    [facility]

## 2020-07-30 NOTE — Assessment & Plan Note (Signed)
She has sporadic episodes of palpitations where her heart rate goes as high as the 140s.  It takes several minutes for her to dissipate.  It is not happening frequently enough for her to wear an ambulatory monitor.  We discussed her getting the [facility] device to use when she is having symptoms.

## 2020-07-30 NOTE — Assessment & Plan Note (Signed)
Lately she has not been motivated to exercise.  She is interested in participating in the PREP program through the [facility].

## 2020-07-30 NOTE — Assessment & Plan Note (Signed)
Ms. Meagan Stout episode of acute systolic and diastolic heart failure that is thought to be due to stress related cardiomyopathy.  She had nonobstructive disease at cath.  She is euvolemic and doing well.  She has not had a repeat assessment of her systolic function since then.  She has been unable to tolerate guideline directed medical therapy due to hypotension.  We will repeat her echocardiogram.

## 2020-07-30 NOTE — Assessment & Plan Note (Signed)
Non-obstrucive CAD at the time of her cath 03/2018.  She does have some chest pressure that is not exertional.  I suspect is more related to her hiatal hernia.  She is going to start exercising with the PREP program through the [facility].  If she does develop any exertional symptoms we will pursue a repeat ischemia evaluation.  For now, continue atorvastatin.

## 2020-08-05 ENCOUNTER — Telehealth: Payer: Self-pay

## 2020-08-05 NOTE — Telephone Encounter (Signed)
Called to discuss PREP program and availability; has a vacation and cataract surgery in June; will call back to discuss dates/times for July class at [facility], hopefully starting week of July 11.

## 2020-08-25 ENCOUNTER — Other Ambulatory Visit: Payer: Self-pay

## 2020-08-25 ENCOUNTER — Ambulatory Visit (HOSPITAL_COMMUNITY): Payer: Medicare Other | Attending: Internal Medicine

## 2020-08-25 DIAGNOSIS — R002 Palpitations: Secondary | ICD-10-CM | POA: Diagnosis not present

## 2020-08-25 DIAGNOSIS — I5042 Chronic combined systolic (congestive) and diastolic (congestive) heart failure: Secondary | ICD-10-CM | POA: Diagnosis not present

## 2020-08-25 LAB — ECHOCARDIOGRAM COMPLETE
Area-P 1/2: 2.73 cm2
P 1/2 time: 614 msec
S' Lateral: 3.3 cm

## 2020-09-01 ENCOUNTER — Telehealth: Payer: Self-pay

## 2020-09-01 NOTE — Telephone Encounter (Signed)
Called to confirm start of PREP class July 19, she reports has a symptomatic knee injury with pending MRI scheduled late July. Will await results and put on call back list for next class, most likely in August.

## 2020-09-06 ENCOUNTER — Other Ambulatory Visit: Payer: Self-pay | Admitting: Orthopedic Surgery

## 2020-09-06 DIAGNOSIS — M25561 Pain in right knee: Secondary | ICD-10-CM

## 2020-09-07 ENCOUNTER — Other Ambulatory Visit: Payer: Self-pay

## 2020-09-07 ENCOUNTER — Ambulatory Visit
Admission: RE | Admit: 2020-09-07 | Discharge: 2020-09-07 | Disposition: A | Discharge status: Home / Self Care | Payer: Medicare Other | Source: Ambulatory Visit | Attending: Orthopedic Surgery | Admitting: Orthopedic Surgery

## 2020-09-07 DIAGNOSIS — M25561 Pain in right knee: Secondary | ICD-10-CM

## 2020-09-10 ENCOUNTER — Other Ambulatory Visit: Payer: Medicare Other

## 2020-09-29 ENCOUNTER — Telehealth: Payer: Self-pay

## 2020-09-29 NOTE — Telephone Encounter (Signed)
Called to discuss availability to participate in next program at [facility] August 22. Left voicemail.

## 2020-10-11 ENCOUNTER — Telehealth: Payer: Self-pay

## 2020-10-11 NOTE — Telephone Encounter (Signed)
Received call today, she wants to start next PREP Class on August 22 every M/W 10-11:15am. Intake assessment visit scheduled for 8/17 at 2p[m.

## 2020-10-13 NOTE — Progress Notes (Signed)
[facility] PREP Progress Report   Patient Details  Name: Meagan Stout MRN: [id number] Date of Birth: 1945-11-19 Age: 75 y.o. PCP: Lorenda Ishihara, MD  Vitals:   10/13/20 1446  BP: 118/60  Pulse: 93  SpO2: 96%  Weight: 199 lb 3.2 oz (90.4 kg)      [facility] Eval - 10/13/20 1400       Referral    Referring Provider Duke Salvia    Reason for referral Heart Failure;Inactivity    Program Start Date 10/18/20      Measurement   Waist Circumference 42 inches    Hip Circumference 46 inches    Body fat 42.4 percent      Information for Trainer   Goals --   eating better, establish exercise routine--cardio and strength training; los 10 lbs. by end of program   Current Exercise walk dog    Orthopedic Concerns --   bilat torn meniscus, currently R worse than L   Current Barriers mysefl   DIL's illness     Timed Up and Go (TUGS)   Timed Up and Go Low risk <9 seconds      Mobility and Daily Activities   I find it easy to walk up or down two or more flights of stairs. 1    I have no trouble taking out the trash. 4    I do housework such as vacuuming and dusting on my own without difficulty. 4    I can easily lift a gallon of milk (8lbs). 4    I can easily walk a mile. 1    I have no trouble reaching into high cupboards or reaching down to pick up something from the floor. 4    I do not have trouble doing out-door work such as Loss adjuster, chartered, raking leaves, or gardening. 1      Mobility and Daily Activities   I feel younger than my age. 3    I feel independent. 4    I feel energetic. 2    I live an active life.  2    I feel strong. 3    I feel healthy. 2    I feel active as other people my age. 2      How fit and strong are you.   Fit and Strong Total Score 37            Past Medical History:  Diagnosis Date   Anxiety    CAD in native artery 07/30/2020   Chronic combined systolic and diastolic heart failure (HCC) 04/12/2018   GERD (gastroesophageal reflux  disease)    Non-Obstructive CAD    a. LHC 04/12/2018: 30% mRCA, 20% 1st Mrg, 20% pLAD. LVEF 45-50% w/ apical hypokinesis suggestive of stress-induced cardiomyopathy.   Obesity (BMI 30.0-34.9) 07/30/2020   Palpitations 07/30/2020   Past Surgical History:  Procedure Laterality Date   HERNIA REPAIR     LEFT HEART CATH AND CORONARY ANGIOGRAPHY N/A 04/12/2018   Procedure: LEFT HEART CATH AND CORONARY ANGIOGRAPHY;  Surgeon: Iran Ouch, MD;  Location: [facility];  Service: Cardiovascular;  Laterality: N/A;   Social History   Tobacco Use  Smoking Status Never  Smokeless Tobacco Never   To begin PREP Classes starting August 22, every M/W 10-11:15a [facility] B Jaxon Mynhier 10/13/2020, 2:52 PM

## 2020-10-18 NOTE — Progress Notes (Signed)
[facility] PREP Weekly Session   Patient Details  Name: Meagan Stout MRN: [id number] Date of Birth: November 05, 1945 Age: 75 y.o. PCP: Lorenda Ishihara, MD  There were no vitals filed for this visit.   [facility] Weekly seesion - 10/18/20 1600       Weekly Session   Topic Discussed Goal setting and welcome to the program   Introductions, review of PREP notebook and program, tour of facility, light cardio workout followed by stretching             Braiden Presutti B Gracious Renken 10/18/2020, 4:05 PM

## 2020-11-01 ENCOUNTER — Telehealth: Payer: Self-pay

## 2020-11-01 NOTE — Telephone Encounter (Signed)
Called her after receiving text message re: knee inflammation/pain; advised to rest/ice/elevate; encouraged to try to attend Monday sessions since they are education sessions instead of the Wednesday sessions since they are cardio/wt. training; shared concern that exercise right now will aggravate knee pain further. Explained that she could enroll in future PREP program if knee pain continues to prevent attendance.

## 2021-01-12 NOTE — Progress Notes (Signed)
Attended 1 education session and 1 workout session, had to stop program for knee injury/issue; will be eligible to attend a future session once injury healed/treated.

## 2021-02-08 ENCOUNTER — Other Ambulatory Visit: Payer: Self-pay | Admitting: Family Medicine

## 2021-02-08 DIAGNOSIS — R5381 Other malaise: Secondary | ICD-10-CM

## 2021-02-10 ENCOUNTER — Other Ambulatory Visit: Payer: Self-pay | Admitting: Family Medicine

## 2021-02-10 DIAGNOSIS — E2839 Other primary ovarian failure: Secondary | ICD-10-CM

## 2021-03-10 ENCOUNTER — Encounter (HOSPITAL_BASED_OUTPATIENT_CLINIC_OR_DEPARTMENT_OTHER): Payer: Self-pay

## 2021-03-10 ENCOUNTER — Ambulatory Visit (HOSPITAL_BASED_OUTPATIENT_CLINIC_OR_DEPARTMENT_OTHER): Payer: Medicare Other | Admitting: Cardiovascular Disease

## 2021-09-02 ENCOUNTER — Telehealth: Payer: Self-pay | Admitting: Cardiovascular Disease

## 2021-09-02 NOTE — Telephone Encounter (Signed)
Kardia strip shows sinus tachycardia 110 bpm which is a normal but fast heart beat. Ensure staying well hydrated, avoiding caffeine. Recommend office visit for EKG and consideration of ZIO monitor. Would recommend BMP, CBC, TSH either at her clinic visit or prior.  Alver Sorrow, NP

## 2021-09-02 NOTE — Telephone Encounter (Signed)
Spoke with patient regarding HR She has been having elevated HR 110-118 for the last 4-5 days, episodes lasts a couple of hours During episode she feels jittery, weak, and has jaw pain Stated she does clinch jaw so discomfort could be from that  Only checked her HR with Kardia device once, showed tachycardia  Tried to send via [employer] and email, could not get to work  Does feel different than the 2020 episodes   Will forward to [place] NP/Dr Cristal Deer for review

## 2021-09-02 NOTE — Telephone Encounter (Signed)
STAT if HR is under 50 or over 120 (normal HR is 60-100 beats per minute)  What is your heart rate?  114, 103, 110 Do you have a log of your heart rate readings (document readings)? yes  Do you have any other symptoms? Weak, jaw pain, frazzled

## 2021-09-02 NOTE — Telephone Encounter (Signed)
Advised patient, scheduled follow up 7/11 with Harrell Lark, patient aware of location  Stated she just had labs few days ago at PCP She will call and ask them to fax to office

## 2021-09-06 ENCOUNTER — Encounter: Payer: Self-pay | Admitting: Physician Assistant

## 2021-09-06 ENCOUNTER — Ambulatory Visit (INDEPENDENT_AMBULATORY_CARE_PROVIDER_SITE_OTHER): Payer: Medicare Other | Admitting: Physician Assistant

## 2021-09-06 ENCOUNTER — Ambulatory Visit (INDEPENDENT_AMBULATORY_CARE_PROVIDER_SITE_OTHER): Payer: Medicare Other

## 2021-09-06 VITALS — BP 126/80 | HR 70 | Ht 68.5 in | Wt 184.0 lb

## 2021-09-06 DIAGNOSIS — I2510 Atherosclerotic heart disease of native coronary artery without angina pectoris: Secondary | ICD-10-CM

## 2021-09-06 DIAGNOSIS — E785 Hyperlipidemia, unspecified: Secondary | ICD-10-CM

## 2021-09-06 DIAGNOSIS — R000 Tachycardia, unspecified: Secondary | ICD-10-CM

## 2021-09-06 NOTE — Progress Notes (Unsigned)
Enrolled for [employer] to mail a ZIO XT long term holter monitor to the patients address on file.   Dr. Chillum to read. 

## 2021-09-06 NOTE — Progress Notes (Unsigned)
Cardiology Office Note:    Date:  09/07/2021   ID:  Meagan Stout, DOB [birth year], MRN [id number]  PCP:  Lewis Moccasin, MD    [facility] Providers Cardiologist:  Chilton Si, MD     Referring MD: No ref. provider found   Chief Complaint  Patient presents with   Follow-up    4 months.   Chest Pain    Heaviness.    History of Present Illness:    Meagan Stout is a 76 y.o. female with a hx of hiatal hernia s/p repair, anxiety, prior tobacco abuse, nonobstructive CAD and hyperlipidemia.  Patient was initially admitted in February 2020 with chest pain and elevated cardiac enzyme.  Echocardiogram obtained on 04/12/2018 showed normal EF 55 to 60%.  Subsequent left heart cath revealed LVEF 45 to 50% with apical hypokinesis, suggestive of a stress-induced cardiomyopathy, mild nonobstructive CAD beta-blocker was later discontinued due to hypotension.  She did not tolerate ACE inhibitor or ARB.  Patient was last seen by Dr. Duke Salvia in June 2022 at which time she reported occasional palpitation that would last up to an hour.  Palpitation was not occurring frequently enough to wear a ambulatory monitor, therefore it was recommended for her to get a Kardia mobile device.  Repeat echocardiogram obtained on 08/26/2018 showed EF 60 to 65%, grade 1 DD, no regional wall motion abnormality, RVSP 32.4 mmHg, trivial MR, trivial AI.  Patient presents today for evaluation of tachycardia.  She first noticed tachycardia last Thursday night.  A week before, she was feeling stressed due to a neighbor.  Kardia mobile device revealed sinus tachycardia with heart rate in the 110s to 120s.  I did not see any significant SVT, A-fib or atrial flutter.  She has been on thyroid medication 25 mcg daily for the past 8 to 10 months.  She is also having unexplained weight loss of 10 pounds in the past year.  She did have a GI issue in 2022 that caused significant weight loss as well.  She recently went to her PCP  last week and had CMP, fasting lipid along with hemoglobin A1c.  Her hemoglobin A1c was 5.9 which improved from the previous 6.0.  She has normal renal function and electrolyte.  I recommended initial work-up including CBC, TSH, free T4 the T3.  She will need a 3-day ZIO monitor to look at heart rate variation.  Heart heart rate is normal in the office.  As long as her heart rate is mostly controlled, I do not think we need to add back the beta-blocker.  However if she has very frequent heart rate variation, may need to consider low-dose beta-blocker.  She has a history of hypotension on beta-blocker in the past.  Past Medical History:  Diagnosis Date   Anxiety    CAD in native artery 07/30/2020   Chronic combined systolic and diastolic heart failure (HCC) 04/12/2018   GERD (gastroesophageal reflux disease)    Non-Obstructive CAD    a. LHC 04/12/2018: 30% mRCA, 20% 1st Mrg, 20% pLAD. LVEF 45-50% w/ apical hypokinesis suggestive of stress-induced cardiomyopathy.   Obesity (BMI 30.0-34.9) 07/30/2020   Palpitations 07/30/2020    Past Surgical History:  Procedure Laterality Date   HERNIA REPAIR     LEFT HEART CATH AND CORONARY ANGIOGRAPHY N/A 04/12/2018   Procedure: LEFT HEART CATH AND CORONARY ANGIOGRAPHY;  Surgeon: Iran Ouch, MD;  Location: [facility];  Service: Cardiovascular;  Laterality: N/A;    Current Medications:  Current Meds  Medication Sig   atorvastatin (LIPITOR) 40 MG tablet Take 1 tablet (40 mg total) by mouth daily at 6 PM.   fluticasone (FLONASE) 50 MCG/ACT nasal spray Place 2 sprays into both nostrils daily.   glycopyrrolate (ROBINUL) 1 MG tablet Take 1 mg by mouth daily as needed (for IBS symptoms).    levothyroxine (SYNTHROID) 25 MCG tablet Take 25 mcg by mouth daily.   nitroGLYCERIN (NITROSTAT) 0.4 MG SL tablet Place 1 tablet (0.4 mg total) under the tongue every 5 (five) minutes x 3 doses as needed for chest pain.   pantoprazole (PROTONIX) 40 MG tablet Take 40 mg  by mouth 2 (two) times daily.     Allergies:   Patient has no known allergies.   Social History   Socioeconomic History   Marital status: Married    Spouse name: Not on file   Number of children: Not on file   Years of education: Not on file   Highest education level: Not on file  Occupational History   Not on file  Tobacco Use   Smoking status: Never   Smokeless tobacco: Never  Substance and Sexual Activity   Alcohol use: Yes    Comment: occ   Drug use: Not on file   Sexual activity: Not on file  Other Topics Concern   Not on file  Social History Narrative   Not on file   Social Determinants of Health   Financial Resource Strain: Not on file  Food Insecurity: Not on file  Transportation Needs: Not on file  Physical Activity: Not on file  Stress: Not on file  Social Connections: Not on file     Family History: The patient's family history includes CAD in her father.  ROS:   Please see the history of present illness.     All other systems reviewed and are negative.  EKGs/Labs/Other Studies Reviewed:    The following studies were reviewed today:  Echo 08/25/2020  1. Left ventricular ejection fraction, by estimation, is 60 to 65%. The  left ventricle has normal function. The left ventricle has no regional  wall motion abnormalities. Left ventricular diastolic parameters are  consistent with Grade I diastolic  dysfunction (impaired relaxation).   2. Right ventricular systolic function is normal. The right ventricular  size is normal. There is normal pulmonary artery systolic pressure. The  estimated right ventricular systolic pressure is 32.4 mmHg.   3. The mitral valve is grossly normal. Trivial mitral valve  regurgitation.   4. The aortic valve is tricuspid. Aortic valve regurgitation is trivial.  Aortic regurgitation PHT measures 614 msec.   5. The inferior vena cava is normal in size with greater than 50%  respiratory variability, suggesting right atrial  pressure of 3 mmHg.   Comparison(s): Changes from prior study are noted. 04/12/2018: LVEF 55-60%,  mild LAE.   EKG:  EKG is ordered today.  The ekg ordered today demonstrates normal sinus rhythm, no significant ST-T wave changes.  Recent Labs: 09/06/2021: Hemoglobin 14.1; Platelets 351; TSH 2.540  Recent Lipid Panel    Component Value Date/Time   CHOL 160 04/12/2018 0045   TRIG 59 04/12/2018 0045   HDL 47 04/12/2018 0045   CHOLHDL 3.4 04/12/2018 0045   VLDL 12 04/12/2018 0045   LDLCALC 101 (H) 04/12/2018 0045     Risk Assessment/Calculations:           Physical Exam:    VS:  BP 126/80 (BP Location: Left Arm, Patient Position:  Sitting, Cuff Size: Normal)   Pulse 70   Ht 5' 8.5" (1.74 m)   Wt 184 lb (83.5 kg)   BMI 27.57 kg/m     Wt Readings from Last 3 Encounters:  09/06/21 184 lb (83.5 kg)  10/13/20 199 lb 3.2 oz (90.4 kg)  07/30/20 198 lb (89.8 kg)     GEN:  Well nourished, well developed in no acute distress HEENT: Normal NECK: No JVD; No carotid bruits LYMPHATICS: No lymphadenopathy CARDIAC: RRR, no murmurs, rubs, gallops RESPIRATORY:  Clear to auscultation without rales, wheezing or rhonchi  ABDOMEN: Soft, non-tender, non-distended MUSCULOSKELETAL:  No edema; No deformity  SKIN: Warm and dry NEUROLOGIC:  Alert and oriented x 3 PSYCHIATRIC:  Normal affect   ASSESSMENT:    1. Tachycardia   2. Coronary artery disease involving native coronary artery of native heart without angina pectoris   3. Hyperlipidemia LDL goal <70    PLAN:    In order of problems listed above:  Sinus tachycardia: Patient has been noticing intermittent atrial tachycardia in the past week.  Heart rate will go up to 110s to 120s.  I recommended the thyroid panel and a CBC.  Recent basic metabolic panel was normal.  I also recommended a 3-day heart monitor to check for heart rate variation.  If she has frequent episode of prolonged tachycardia, may consider very low-dose beta-blocker.   Note, she has a history of hypotension on beta-blocker in the past.  CAD: History of nonobstructive CAD.  He denies any recent chest pain  Hyperlipidemia: On Lipitor.           Medication Adjustments/Labs and Tests Ordered: Current medicines are reviewed at length with the patient today.  Concerns regarding medicines are outlined above.  Orders Placed This Encounter  Procedures   TSH+T4F+T3Free   CBC   LONG TERM MONITOR (3-14 DAYS)   EKG 12-Lead   No orders of the defined types were placed in this encounter.   Patient Instructions  Medication Instructions:  Your physician recommends that you continue on your current medications as directed. Please refer to the Current Medication list given to you today.  *If you need a refill on your cardiac medications before your next appointment, please call your pharmacy*   Lab Work: CBC, Full Thyroid Panel  If you have labs (blood work) drawn today and your tests are completely normal, you will receive your results only by: MyChart Message (if you have MyChart) OR A paper copy in the mail If you have any lab test that is abnormal or we need to change your treatment, we will call you to review the results.   Testing/Procedures: Christena Deem- Long Term Monitor Instructions  Your physician has requested you wear a ZIO patch monitor for 3 days.  This is a single patch monitor. [employer] supplies one patch monitor per enrollment. Additional stickers are not available. Please do not apply patch if you will be having a Nuclear Stress Test,  Echocardiogram, Cardiac CT, MRI, or Chest Xray during the period you would be wearing the  monitor. The patch cannot be worn during these tests. You cannot remove and re-apply the  ZIO XT patch monitor.  Your ZIO patch monitor will be mailed 3 day USPS to your address on file. It may take 3-5 days  to receive your monitor after you have been enrolled.  Once you have received your monitor, please review the  enclosed instructions. Your monitor  has already been registered assigning a specific  monitor serial # to you.  Billing and Patient Assistance Program Information  We have supplied [employer] with any of your insurance information on file for billing purposes. [employer] offers a sliding scale Patient Assistance Program for patients that do not have  insurance, or whose insurance does not completely cover the cost of the ZIO monitor.  You must apply for the Patient Assistance Program to qualify for this discounted rate.  To apply, please call [employer] at [phone], select option 4, select option 2, ask to apply for  Patient Assistance Program. M[employer]eth [employer] will ask your household income, and how many people  are in your household. They will quote your out-of-pocket cost based on that information.  [employer] will also be able to set up a 3367-month, interest-free payment plan if needed.  Applying the monitor   Shave hair from upper left chest.  Hold abrader disc by orange tab. Rub abrader in 40 strokes over the upper left chest as  indicated in your monitor instructions.  Clean area with 4 enclosed alcohol pads. Let dry.  Apply patch as indicated in monitor instructions. Patch will be placed under collarbone on left  side of chest with arrow pointing upward.  Rub patch adhesive wings for 2 minutes. Remove white label marked "1". Remove the white  label marked "2". Rub patch adhesive wings for 2 additional minutes.  While looking in a mirror, press and release button in center of patch. A small green light will  flash 3-4 times. This will be your only indicator that the monitor has been turned on.  Do not shower for the first 24 hours. You may shower after the first 24 hours.  Press the button if you feel a symptom. You will hear a small click. Record Date, Time and  Symptom in the Patient Logbook.  When you are ready to remove the patch, follow instructions on the last 2 pages of Patient  Logbook.  Stick patch monitor onto the last page of Patient Logbook.  Place Patient Logbook in the blue and white box. Use locking tab on box and tape box closed  securely. The blue and white box has prepaid postage on it. Please place it in the mailbox as  soon as possible. Your physician should have your test results approximately 7 days after the  monitor has been mailed [facility] to [facility].  Call [facility] Customer Care at [phone] if you have questions regarding  your ZIO XT patch monitor. Call them immediately if you see an orange light blinking on your  monitor.  If your monitor falls off in less than 4 days, contact our Monitor department at [phone].  If your monitor becomes loose or falls off after 4 days call [employer] at [phone] for  suggestions on securing your monitor    Follow-Up: At [facility], you and your health needs are our priority.  As part of our continuing mission to provide you with exceptional heart care, we have created designated Provider Care Teams.  These Care Teams include your primary Cardiologist (physician) and Advanced Practice Providers (APPs -  Physician Assistants and Nurse Practitioners) who all work together to provide you with the care you need, when you need it.  We recommend signing up for the patient portal called "MyChart".  Sign up information is provided on this After Visit Summary.  MyChart is used to connect with patients for Virtual Visits (Telemedicine).  Patients are able to view lab/test results, encounter notes, upcoming appointments, etc.  Non-urgent messages  can be sent to your provider as well.   To learn more about what you can do with MyChart, go to [email or web address].    Your next appointment:   3 week(s)  The format for your next appointment:   In Person  Provider:   Azalee Course, PA-C        Other Instructions NONE.  Important Information About Sugar         Ramond Dial, [place]  09/07/2021 11:55 PM     [facility]

## 2021-09-06 NOTE — Patient Instructions (Addendum)
Medication Instructions:  Your physician recommends that you continue on your current medications as directed. Please refer to the Current Medication list given to you today.  *If you need a refill on your cardiac medications before your next appointment, please call your pharmacy*   Lab Work: CBC, Full Thyroid Panel  If you have labs (blood work) drawn today and your tests are completely normal, you will receive your results only by: MyChart Message (if you have MyChart) OR A paper copy in the mail If you have any lab test that is abnormal or we need to change your treatment, we will call you to review the results.   Testing/Procedures: Christena Deem- Long Term Monitor Instructions  Your physician has requested you wear a ZIO patch monitor for 3 days.  This is a single patch monitor. [employer] supplies one patch monitor per enrollment. Additional stickers are not available. Please do not apply patch if you will be having a Nuclear Stress Test,  Echocardiogram, Cardiac CT, MRI, or Chest Xray during the period you would be wearing the  monitor. The patch cannot be worn during these tests. You cannot remove and re-apply the  ZIO XT patch monitor.  Your ZIO patch monitor will be mailed 3 day USPS to your address on file. It may take 3-5 days  to receive your monitor after you have been enrolled.  Once you have received your monitor, please review the enclosed instructions. Your monitor  has already been registered assigning a specific monitor serial # to you.  Billing and Patient Assistance Program Information  We have supplied [employer] with any of your insurance information on file for billing purposes. [employer] offers a sliding scale Patient Assistance Program for patients that do not have  insurance, or whose insurance does not completely cover the cost of the ZIO monitor.  You must apply for the Patient Assistance Program to qualify for this discounted rate.  To apply, please call [employer] at  [phone], select option 4, select option 2, ask to apply for  Patient Assistance Program. Meredeth Ide will ask your household income, and how many people  are in your household. They will quote your out-of-pocket cost based on that information.  [employer] will also be able to set up a 28-month, interest-free payment plan if needed.  Applying the monitor   Shave hair from upper left chest.  Hold abrader disc by orange tab. Rub abrader in 40 strokes over the upper left chest as  indicated in your monitor instructions.  Clean area with 4 enclosed alcohol pads. Let dry.  Apply patch as indicated in monitor instructions. Patch will be placed under collarbone on left  side of chest with arrow pointing upward.  Rub patch adhesive wings for 2 minutes. Remove white label marked "1". Remove the white  label marked "2". Rub patch adhesive wings for 2 additional minutes.  While looking in a mirror, press and release button in center of patch. A small green light will  flash 3-4 times. This will be your only indicator that the monitor has been turned on.  Do not shower for the first 24 hours. You may shower after the first 24 hours.  Press the button if you feel a symptom. You will hear a small click. Record Date, Time and  Symptom in the Patient Logbook.  When you are ready to remove the patch, follow instructions on the last 2 pages of Patient  Logbook. Stick patch monitor onto the last page of Patient Logbook.  Place Patient Logbook in the blue and white box. Use locking tab on box and tape box closed  securely. The blue and white box has prepaid postage on it. Please place it in the mailbox as  soon as possible. Your physician should have your test results approximately 7 days after the  monitor has been mailed back to [facility].  Call [facility] Customer Care at [phone] if you have questions regarding  your ZIO XT patch monitor. Call them immediately if you see an orange light  blinking on your  monitor.  If your monitor falls off in less than 4 days, contact our Monitor department at [phone].  If your monitor becomes loose or falls off after 4 days call [employer] at [phone] for  suggestions on securing your monitor    Follow-Up: At [facility], you and your health needs are our priority.  As part of our continuing mission to provide you with exceptional heart care, we have created designated Provider Care Teams.  These Care Teams include your primary Cardiologist (physician) and Advanced Practice Providers (APPs -  Physician Assistants and Nurse Practitioners) who all work together to provide you with the care you need, when you need it.  We recommend signing up for the patient portal called "MyChart".  Sign up information is provided on this After Visit Summary.  MyChart is used to connect with patients for Virtual Visits (Telemedicine).  Patients are able to view lab/test results, encounter notes, upcoming appointments, etc.  Non-urgent messages can be sent to your provider as well.   To learn more about what you can do with MyChart, go to [email or web address].    Your next appointment:   3 week(s)  The format for your next appointment:   In Person  Provider:   Azalee Course, PA-C        Other Instructions NONE.  Important Information About Sugar

## 2021-09-07 LAB — CBC
Hematocrit: 41.9 % (ref 34.0–46.6)
Hemoglobin: 14.1 g/dL (ref 11.1–15.9)
MCH: 28.5 pg (ref 26.6–33.0)
MCHC: 33.7 g/dL (ref 31.5–35.7)
MCV: 85 fL (ref 79–97)
Platelets: 351 10*3/uL (ref 150–450)
RBC: 4.94 x10E6/uL (ref 3.77–5.28)
RDW: 14 % (ref 11.7–15.4)
WBC: 9.1 10*3/uL (ref 3.4–10.8)

## 2021-09-07 LAB — TSH+T4F+T3FREE
Free T4: 1.29 ng/dL (ref 0.82–1.77)
T3, Free: 2.5 pg/mL (ref 2.0–4.4)
TSH: 2.54 u[IU]/mL (ref 0.450–4.500)

## 2021-09-10 DIAGNOSIS — R000 Tachycardia, unspecified: Secondary | ICD-10-CM | POA: Diagnosis not present

## 2021-10-01 NOTE — Progress Notes (Deleted)
Cardiology Office Note:    Date:  10/01/2021   ID:  Meagan Stout, DOB [birth year], MRN [id number]  PCP:  Lewis Moccasin, MD  Cardiologist:  Chilton Si, MD  Electrophysiologist:  None   Referring MD: Lewis Moccasin, MD   Chief Complaint: follow-up of tachycardia   History of Present Illness:    Meagan Stout is a 76 y.o. 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 Duke Salvia and presents today for follow-up of tachycardia.   Patient was admitted in 03/2018 with chest pain and found to have elevated troponin. She underwent LHC at that time which showed only mild non-obstructive disease with mildly reduced LV systolic function of 45% and apical hypokinesis suggestive of stress-induced cardiomyopathy. Echo showed LVEF of 55-60% with grade 1 diastolic dysfunction. GDMT was limited by soft BP. Last Echo in 07/2020 showed LVEF of 60-65% with grade 1 diastolic dysfunction.  Patient was recently seen by Azalee Course, PA-C, on 7/***/2023 at which time she reported intermittent tachycardia. Her Kardia mobile device showed sinus tachycardia with rates in the 110s to 120s but no clear SVT or atrial fibrillation/flutter. She also reported unexplained weight loss. Thyroid function test and CBC were normal. 3 day Zio monitor was ordered for further evaluation. Final read still pending but preliminary read showed underlying sinus rhythm with average heart rate of 74 bpm (min 51 bpm, max 139 bpm) with isolated PACs/PVCs but no significant arrhythmias.   Patient presents today for follow-up.   Sinus Tachycardia Patient reported intermittent tachycardia at visit last month. Preliminary read of 3 day Zio monitor showed underlying sinus rhythm with average heart rate of  74 bpm (min 51 bpm, max 139 bpm) with isolated PACs/PVCs but no significant arrhythmias.  - *** - Given no concerning arrhythmias on monitor and normal average heart rate, I would not recommended any AV nodal agents. She has also had hypotension with beta-blockers in the past.  Non-Obstructive CAD Mild CAD noted on LHC in 2020. - No recent chest pain. - Continue statin.  Stress-Induced Cardiomyopathy LHC in 2020 during an admission for chest pain showed mild disease with LV of 45% and apical hypokinesis suggestive of stress-induced cardiomyopathy. However, Echo during that admission showed normal LV function. Last Echo 07/2020 showed LVEF of 60-65% with grade 1 diastolic dysfunction. - *** - GDMT limited due to hypotension in the past. Will not try to add any medications now given normal EF and no signs or symptoms of CHF.  Hyperlipidemia Most recent lipid panel in ***. LDL goal <70 given CAD. - Continue Lipitor 40mg  daily. - ***  Past Medical History:  Diagnosis Date   Anxiety    CAD in native artery 07/30/2020   Chronic combined systolic and diastolic heart failure (HCC) 04/12/2018   GERD (gastroesophageal reflux disease)    Non-Obstructive CAD    a. LHC 04/12/2018: 30% mRCA, 20% 1st Mrg, 20% pLAD. LVEF 45-50% w/ apical hypokinesis suggestive of stress-induced cardiomyopathy.   Obesity (BMI 30.0-34.9) 07/30/2020   Palpitations 07/30/2020    Past Surgical History:  Procedure Laterality Date   HERNIA REPAIR     LEFT HEART CATH AND CORONARY ANGIOGRAPHY N/A 04/12/2018   Procedure: LEFT HEART CATH AND CORONARY ANGIOGRAPHY;  Surgeon: 04/14/2018, MD;  Location: [facility] INVASIVE CV LAB;  Service: Cardiovascular;  Laterality: N/A;    Current Medications: No outpatient medications have been marked as taking for the 10/06/21 encounter (Appointment) with Corrin Parker, PA-C.     Allergies:   Patient has no known allergies.   Social History   Socioeconomic History   Marital status:  Married    Spouse name: Not on file   Number of children: Not on file   Years of education: Not on file   Highest education level: Not on file  Occupational History   Not on file  Tobacco Use   Smoking status: Never   Smokeless tobacco: Never  Substance and Sexual Activity   Alcohol use: Yes    Comment: occ   Drug use: Not on file   Sexual activity: Not on file  Other Topics Concern   Not on file  Social History Narrative   Not on file   Social Determinants of Health   Financial Resource Strain: Not on file  Food Insecurity: Not on file  Transportation Needs: Not on file  Physical Activity: Not on file  Stress: Not on file  Social Connections: Not on file     Family History: The patient's family history includes CAD in her father.  ROS:   Please see the history of present illness.     EKGs/Labs/Other Studies Reviewed:    The following studies were reviewed today:  Left Cardiac Catheterization 04/12/2018: Mid RCA lesion is 30% stenosed. There is mild left ventricular systolic dysfunction. LV end diastolic pressure is normal. The left ventricular ejection fraction is 45-50% by visual estimate. 1st Mrg lesion is 20% stenosed. Prox LAD lesion is 20% stenosed.   1.  Mild nonobstructive coronary artery disease. 2.  Mildly reduced LV systolic function with an EF of 45% with apical hypokinesis suggestive of stress-induced cardiomyopathy. 3.  High normal left ventricular end-diastolic pressure.   Recommendations: I do not see any culprit for non-ST elevation myocardial infarction.  Wall motion abnormalities suggestive of stress-induced cardiomyopathy.  Recommend medical therapy.  Obtain an echocardiogram.  Diagnostic Dominance: Right  _______________  Echocardiogram 08/25/2020: Impressions:  1. Left ventricular ejection fraction, by estimation, is 60 to 65%. The  left ventricle has normal function. The left ventricle has no regional  wall motion abnormalities.  Left ventricular diastolic parameters are  consistent with Grade I diastolic  dysfunction (impaired relaxation).   2. Right ventricular systolic function is normal. The right ventricular  size is normal. There is normal pulmonary artery systolic pressure. The  estimated right ventricular systolic pressure is 32.4 mmHg.   3. The mitral valve is grossly normal. Trivial mitral valve  regurgitation.   4. The aortic valve is tricuspid. Aortic valve regurgitation is trivial.  Aortic regurgitation PHT measures 614 msec.   5. The inferior vena cava is normal in size with greater than 50%  respiratory variability, suggesting right atrial pressure of 3 mmHg.   Comparison(s): Changes from prior study are noted. 04/12/2018: LVEF 55-60%,  mild LAE.  _______________  [employer] Monitor 09/10/2021 to 09/13/2021 (Preliminary Read): Patient had a min HR of 51 bpm, max HR of 139 bpm, and avg HR of 74 bpm. Predominant underlying rhythm was Sinus Rhythm. Isolated SVEs were occasional (1.5%, 4873), SVE Couplets were rare (<1.0%, 71), and no SVE Triplets were present. Isolated VEs were  rare (<1.0%), VE Couplets were rare (<1.0%), and no VE Triplets were present.   EKG:  EKG ordered today. EKG personally reviewed and  demonstrates ***.  Recent Labs: 09/06/2021: Hemoglobin 14.1; Platelets 351; TSH 2.540  Recent Lipid Panel    Component Value Date/Time   CHOL 160 04/12/2018 0045   TRIG 59 04/12/2018 0045   HDL 47 04/12/2018 0045   CHOLHDL 3.4 04/12/2018 0045   VLDL 12 04/12/2018 0045   LDLCALC 101 (H) 04/12/2018 0045    Physical Exam:    Vital Signs: There were no vitals taken for this visit.    Wt Readings from Last 3 Encounters:  09/06/21 184 lb (83.5 kg)  10/13/20 199 lb 3.2 oz (90.4 kg)  07/30/20 198 lb (89.8 kg)     General: 76 y.o. female in no acute distress. HEENT: Normocephalic and atraumatic. Sclera clear. EOMs intact. Neck: Supple. No carotid bruits. No JVD. Heart: *** RRR. Distinct S1 and  S2. No murmurs, gallops, or rubs. Radial and distal pedal pulses 2+ and equal bilaterally. Lungs: No increased work of breathing. Clear to ausculation bilaterally. No wheezes, rhonchi, or rales.  Abdomen: Soft, non-distended, and non-tender to palpation. Bowel sounds present in all 4 quadrants.  MSK: Normal strength and tone for age. *** Extremities: No lower extremity edema.    Skin: Warm and dry. Neuro: Alert and oriented x3. No focal deficits. Psych: Normal affect. Responds appropriately.   Assessment:    No diagnosis found.  Plan:     Disposition: Follow up in ***   Medication Adjustments/Labs and Tests Ordered: Current medicines are reviewed at length with the patient today.  Concerns regarding medicines are outlined above.  No orders of the defined types were placed in this encounter.  No orders of the defined types were placed in this encounter.   There are no Patient Instructions on file for this visit.   Signed, Darreld Mclean, PA-C  10/01/2021 1:16 PM    [facility]

## 2021-10-04 ENCOUNTER — Telehealth: Payer: Self-pay | Admitting: Physician Assistant

## 2021-10-04 NOTE — Telephone Encounter (Signed)
Spoke with pt regarding her call about monitor results. Pt is unable to make her follow up appointment with Wynema Birch, PA due to a family emergency with her daughter. Explained to pt that we have the report however Wynema Birch has not had an opportunity to comment yet. Pt just wanted to let us know if there was anything concerning or change in medications that she would like a call back to discuss. Will route to provider. Pt verbalizes understanding.

## 2021-10-04 NOTE — Telephone Encounter (Signed)
Patient is calling due to having to cancel her 08/11 appt. She reports she is currently in [place] with a sick family member and will not be back for the appointment. Due to this she is requesting a callback for the heart monitor results instead. Please advise.

## 2021-10-06 ENCOUNTER — Ambulatory Visit: Payer: Medicare Other | Admitting: Student

## 2022-01-30 IMAGING — MR MR KNEE*R* W/O CM
4 of 6 series · 22 of 40 positions shown · non-contrast
Comparison: None.

CLINICAL DATA: Medial right knee pain since 08/15/2020

EXAM:
MRI OF THE RIGHT KNEE WITHOUT CONTRAST
TECHNIQUE: Multiplanar, multisequence MR imaging of the knee was performed. No
intravenous contrast was administered.

[Series 3: T2 fat-sat · axial · 4.0mm · 0.62mm/px · z∈[-92,+7]mm · 5 of 26 slices shown (1 of 2)]
[im 1/26]
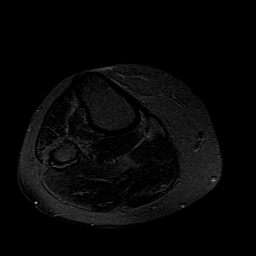
[im 5/26]
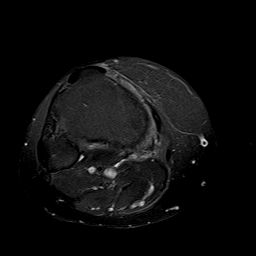
[im 9/26]
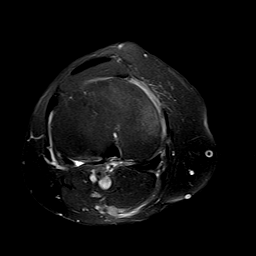
[im 13/26]
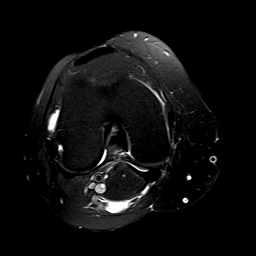
[im 21/26]
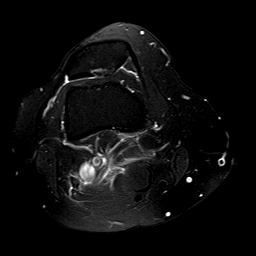

[Series 5: T2 fat-sat · coronal · 4.0mm · 0.29mm/px · 3 of 24 slices shown (2 of 2)]
[im 5/24]
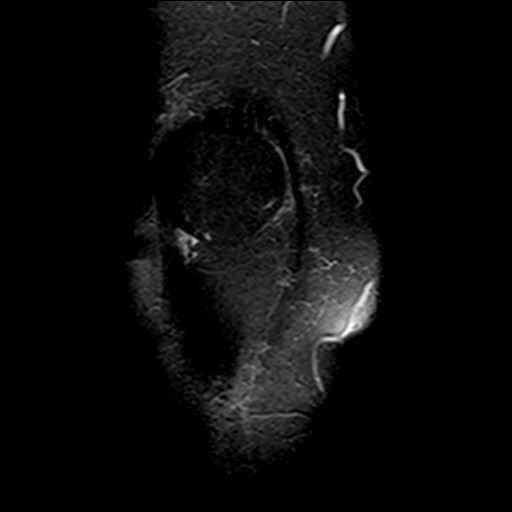
[im 14/24]
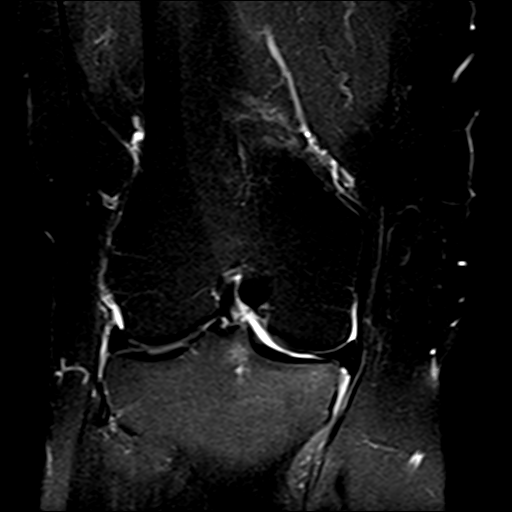
[im 24/24]
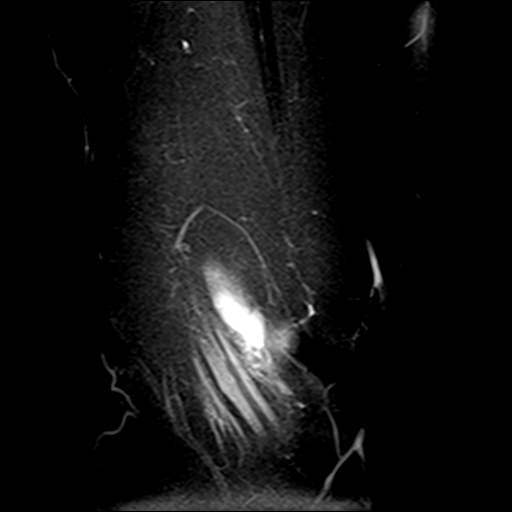

[Series 6: PD fat-sat · coronal · 3.0mm · 0.29mm/px · 7 of 29 slices shown (1 of 2)]
[im 1/29]
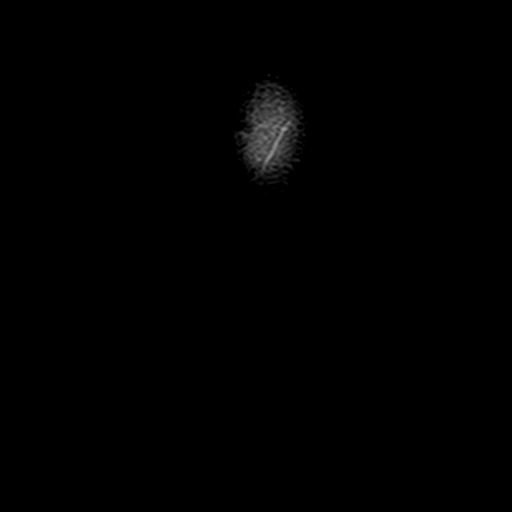
[im 5/29]
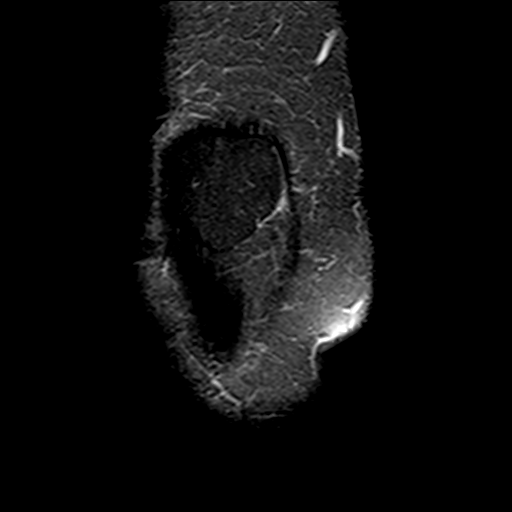
[im 10/29]
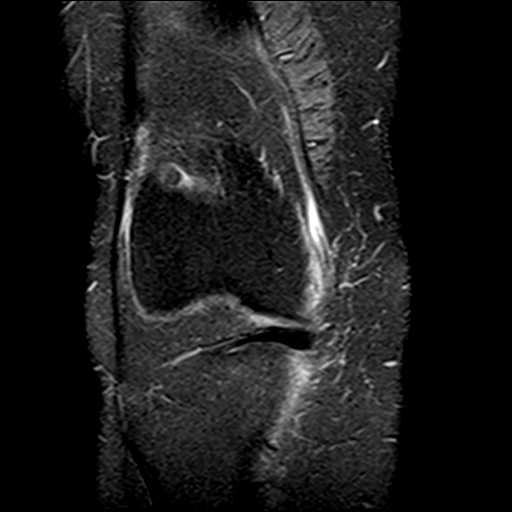
[im 15/29]
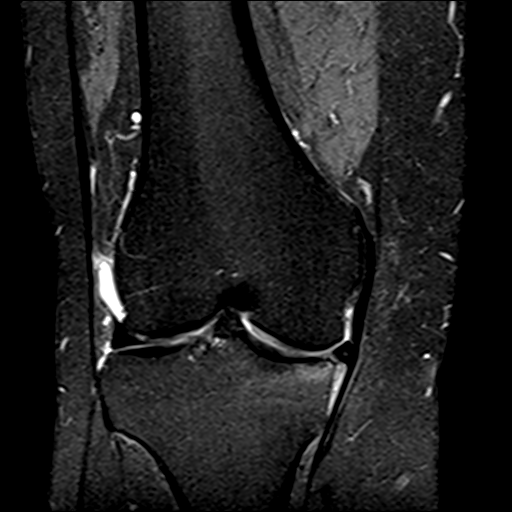
[im 19/29]
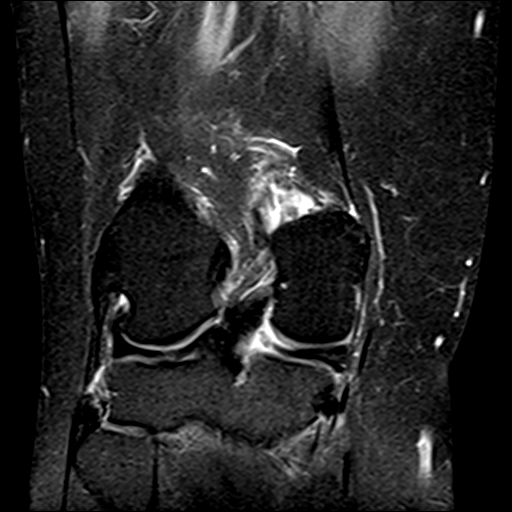
[im 24/29]
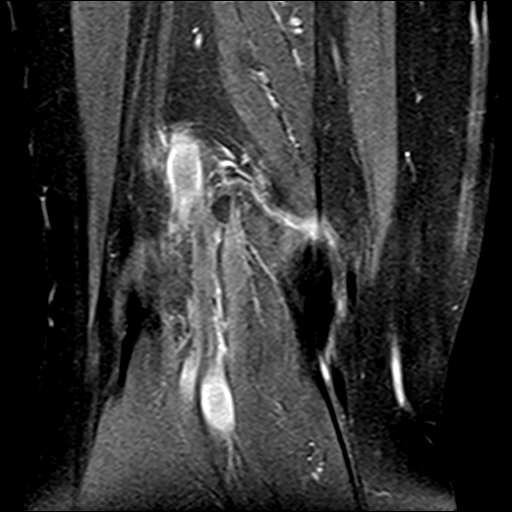
[im 29/29]
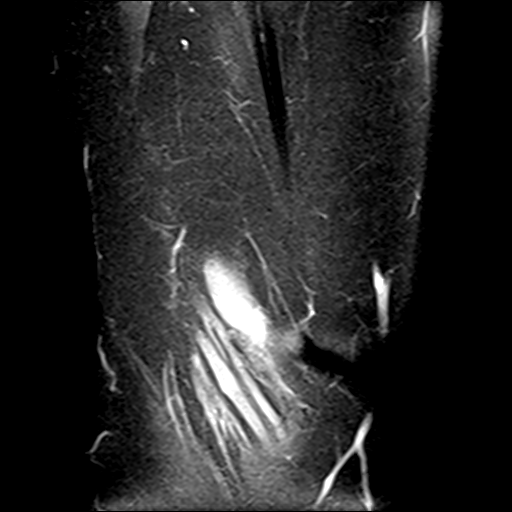

[Series 8: PD fat-sat · sagittal · 3.0mm · 0.29mm/px · 7 of 27 slices shown (2 of 2)]
[im 1/27]
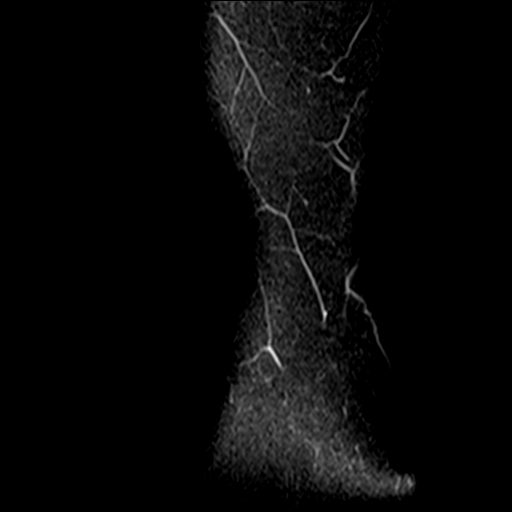
[im 5/27]
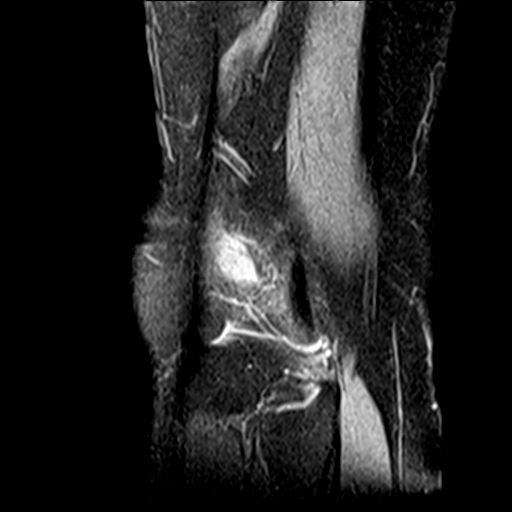
[im 9/27]
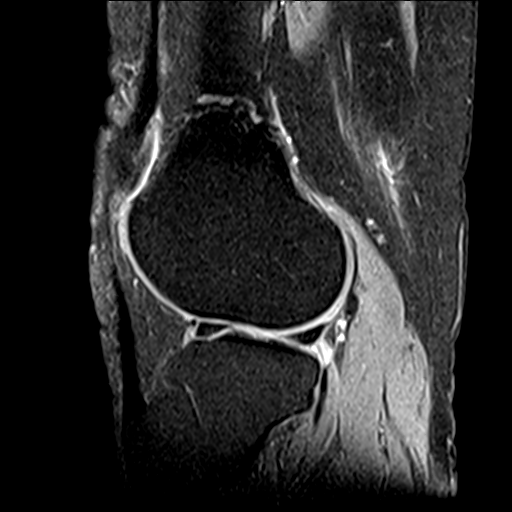
[im 14/27]
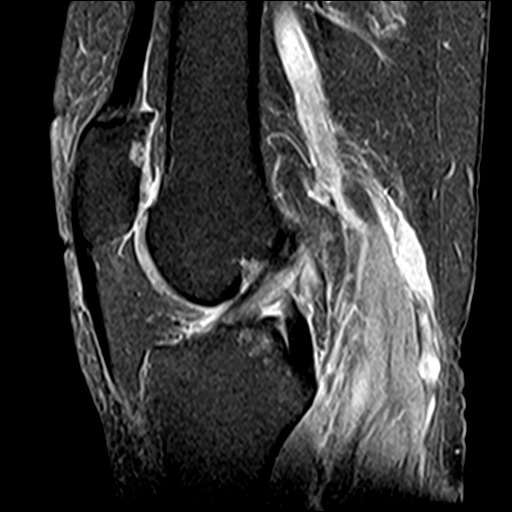
[im 18/27]
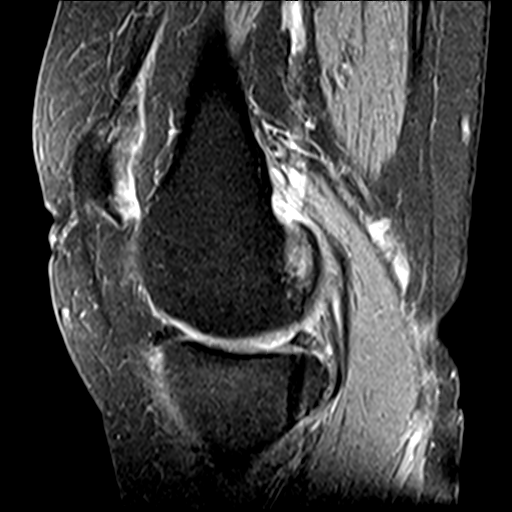
[im 22/27]
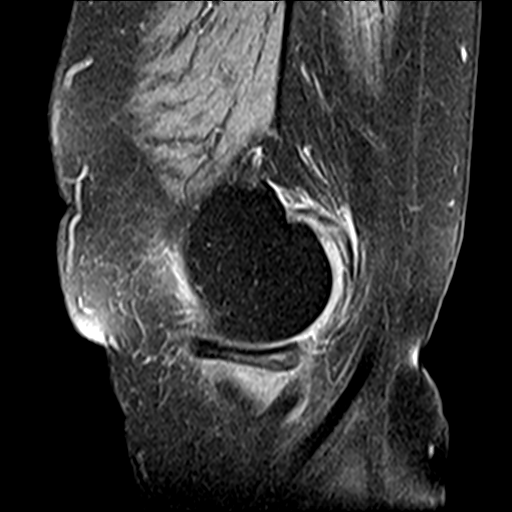
[im 27/27]
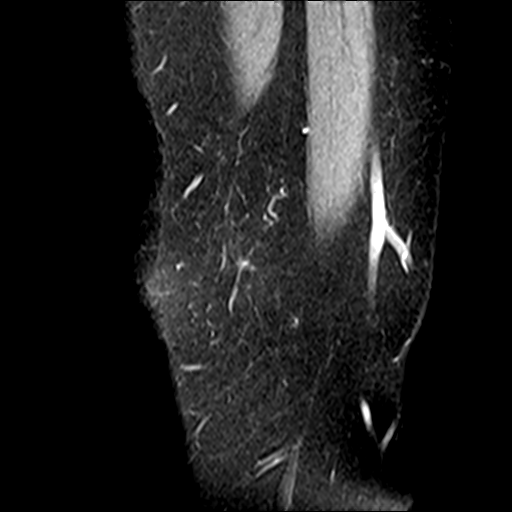

[22 of 40 positions shown; findings below may reference images not displayed]

FINDINGS: MENISCI

Medial meniscus: Complete radial tear at the posterior root
attachment of the medial meniscus (series 6, image 10). Associated
extrusion of the medial meniscal body. Free edge blunting of the
midbody with oblique tear extending to the inferior articular
surface (series 6, images 13-15).

Lateral meniscus: Mild free edge fraying of the midbody. Otherwise
intact.

LIGAMENTS

Cruciates:  Intact ACL and PCL.

Collaterals: Medial collateral ligament is intact. Lateral
collateral ligament complex is intact.

CARTILAGE

Patellofemoral: Diffuse chondral thinning with multifocal
full-thickness cartilage fissures of the lateral patellar facet and
patellar apex. No trochlear chondral defect.

Medial: Moderate chondral thinning involving the peripheral aspect
of the medial tibial plateau.

Lateral:  No chondral defect.

Joint: No significant joint effusion. Focal edema within the
superolateral aspect of Hoffa's fat.

Popliteal Fossa: Small ruptured Baker's cyst. Intact popliteus
tendon.

Extensor Mechanism:  Intact quadriceps tendon and patellar tendon.

Bones: Small subchondral fracture involving the periphery of the
medial tibial plateau with mild adjacent marrow edema (series 7,
images 20-21). Medial and patellofemoral compartment joint space
narrowing with tiny marginal osteophytes. Patella alta alignment. No
suspicious bone lesion.

Other: None.
IMPRESSION: 1. Complete radial tear at the posterior root attachment of the
medial meniscus with associated extrusion of the medial meniscal
body. Free edge blunting of the midbody of the lateral meniscus with
oblique tear extending to the inferior articular surface.
2. Small subchondral fracture involving the periphery of the medial
tibial plateau with mild adjacent marrow edema.
3. Medial and patellofemoral compartment osteoarthritis with
multifocal full thickness chondral fissures of the patella.
4. Patella alta alignment with focal edema within the superolateral
aspect of Hoffa's fat pad, which can be seen in the setting of
patellar tendon-lateral femoral condyle friction syndrome.
5. Small ruptured Baker's cyst.

## 2022-11-23 NOTE — Progress Notes (Signed)
Cardiology Office Note:  .    Date:  11/27/2022  ID:  Meagan Stout, DOB [birth year], MRN [id number] PCP: Lewis Moccasin, MD  [facility] Providers Cardiologist:  Chilton Si, MD     History of Present Illness: .    Meagan Stout is a 78 y.o. female with a hx of GERD, hiatal hernia s/p repair, anxiety, prior tobacco abuse, and Non-Obstructive CAD who presents today for follow-up. I last saw her in the ED on 04/12/2018. She was admitted 04/11/2018 with chest pain and elevated cardiac enzymes. She had an Echo 04/12/2018 that found normal systolic function and an ejection fraction of 55-60%. A left heart catheterization was also performed and showed LVEF 45-50% with apical hypokinesis, suggestive of stress-induced cardiomyopathy. She had mild non-obstructive CAD. She followed up with Bailey Mech most recently 05/2019 and was doing well. Beta-blocker was discontinued due to hypotension. She does not tolerate ace inhibitors or ARBs.   At her visit 07/2020, she complained of occasional episodes of racing palpitations with heart rates in the 140s. They were not frequent enough to recommend wearing an ambulatory monitor. Instead we discussed the [facility] device. She also noted infrequent aching chest discomfort. She continued to work on following a healthy diet but struggled to stay motivated with exercise. She was referred to [facility].  She was seen by Azalee Course, PA 08/2021 and presented a Lourena Simmonds tracing that revealed sinus tachycardia with heart rate in the 110s-120s on his review. No significant SVT, Afib, or atrial flutter. EKG in the office showed NSR, no significant ST-T wave changes. She wore a 3-day Zio monitor that showed predominantly NSR with occasional PACs (1.5%), rare PVCs and rare ventricular couplets. Few patient triggered events all correlated with sinus rhythm.  Today, she affirms that occasionally she still has episodes of heart racing. Yesterday she had an episode with HR  115 bpm, off and on lasting for an hour before subsiding on its own. Recent episodes have not been as severe as when she had been seen by Azalee Course since she also had shortness of breath at that time. She hasn't tried using her Lourena Simmonds lately. EKG performed in the office showed NSR at 99 bpm with left atrial enlargement. Of note, she has been under increased stress since her daughter-in-law passed away last year. She reports having a torn meniscus in her bilateral knees. Recently she was initiated on gel injection therapy; she received her third dose yesterday. Overall her knees have significantly limited her physical activity. She denies any anginal symptoms. She confirms that her atorvastatin was recently reduced to 20 mg due to her recent lab work. She presents records of this from April 2024 which are personally reviewed and show LDL 81 and A1c in prediabetes range. About 45+ years ago she successfully quit smoking. She denies any chest pain, shortness of breath, peripheral edema, lightheadedness, headaches, syncope, orthopnea, or PND.  ROS:  Please see the history of present illness. All other systems are reviewed and negative.  (+) Palpitations (+) Bilateral knee pain (+) Stress  Studies Reviewed: Marland Kitchen   EKG Interpretation Date/Time:  Friday November 24 2022 08:05:28 EDT Ventricular Rate:  99 PR Interval:  130 QRS Duration:  98 QT Interval:  346 QTC Calculation: 444 R Axis:   -13  Text Interpretation: Normal sinus rhythm Possible Left atrial enlargement When compared with ECG of 12-Apr-2018 04:54, Septal infarct is now Present T wave inversion no longer evident in Inferior leads T wave inversion  no longer evident in Anterolateral leads QT has shortened Confirmed by Chilton Si ([id number]) on 11/24/2022 8:13:32 AM    3 Day Zio Monitor  08/2021: Quality: Fair.  Baseline artifact. Predominant rhythm: sinus rhythm Average heart rate: 74 bpm Max heart rate: 139 bpm Min heart rate: 51 bpm Pauses  >2.5 seconds: none Occasional PACs (1.5%) Rare PVCs and ventricular couplets  Risk Assessment/Calculations:         Physical Exam:    VS:  BP 120/70 (BP Location: Left Arm, Patient Position: Sitting, Cuff Size: Normal)   Pulse 99   Ht 5' 8.2" (1.732 m)   Wt 190 lb 6.4 oz (86.4 kg)   SpO2 95%   BMI 28.78 kg/m  , BMI Body mass index is 28.78 kg/m. GENERAL:  Well appearing HEENT: Pupils equal round and reactive, fundi not visualized, oral mucosa unremarkable NECK:  No jugular venous distention, waveform within normal limits, carotid upstroke brisk and symmetric, no bruits, no thyromegaly LUNGS:  Clear to auscultation bilaterally HEART:  RRR.  PMI not displaced or sustained,S1 and S2 within normal limits, no S3, no S4, no clicks, no rubs, no murmurs ABD:  Flat, positive bowel sounds normal in frequency in pitch, no bruits, no rebound, no guarding, no midline pulsatile mass, no hepatomegaly, no splenomegaly EXT:  2 plus pulses throughout, no edema, no cyanosis no clubbing SKIN:  No rashes no nodules NEURO:  Cranial nerves II through XII grossly intact, motor grossly intact throughout PSYCH:  Cognitively intact, oriented to person place and time  Wt Readings from Last 3 Encounters:  11/24/22 190 lb 6.4 oz (86.4 kg)  09/06/21 184 lb (83.5 kg)  10/13/20 199 lb 3.2 oz (90.4 kg)     ASSESSMENT AND PLAN: .    # Episodic Tachycardia Reports of occasional heart racing, with rates up to 115 bpm. Previous monitor showed sinus tachycardia. No associated chest pain or pressure.  This is an ongoing issue and thyroid, electrolytes and CBC have been unremarkable. -Use KardiaMobile to capture episodes of heart racing for further evaluation. -Consider metoprolol as needed if episodes become more frequent or bothersome.  She is not interested at this time.  She notes that stress may be contributing.   # Non-obstructive CAD:  # Hyperlipidemia Atorvastatin dose was reduced by 20mg . Last LDL was 82,  goal is under 70. -Check lipid panel and comprehensive metabolic panel today. -Based on results, may advise to increase atorvastatin dose.  #  Bilateral Knee Pain Torn meniscus in both knees, limiting physical activity. Recently started gel shots. -Encouraged to find low-impact exercises such as water activities or recumbent bike.       Dispo:  FU with Harmonie Verrastro C. Duke Salvia, MD, [facility] in 4 months  I,Mathew Stumpf,acting as a Neurosurgeon for Chilton Si, MD.,have documented all relevant documentation on the behalf of Chilton Si, MD,as directed by  Chilton Si, MD while in the presence of Chilton Si, MD.  I, Rayley Gao C. Duke Salvia, MD have reviewed all documentation for this visit.  The documentation of the exam, diagnosis, procedures, and orders on 11/27/2022 are all accurate and complete.   Signed, Chilton Si, MD

## 2022-11-24 ENCOUNTER — Ambulatory Visit (INDEPENDENT_AMBULATORY_CARE_PROVIDER_SITE_OTHER): Payer: Medicare Other | Admitting: Cardiovascular Disease

## 2022-11-24 ENCOUNTER — Encounter (HOSPITAL_BASED_OUTPATIENT_CLINIC_OR_DEPARTMENT_OTHER): Payer: Self-pay | Admitting: Cardiovascular Disease

## 2022-11-24 VITALS — BP 120/70 | HR 99 | Ht 68.2 in | Wt 190.4 lb

## 2022-11-24 DIAGNOSIS — E669 Obesity, unspecified: Secondary | ICD-10-CM

## 2022-11-24 DIAGNOSIS — R002 Palpitations: Secondary | ICD-10-CM

## 2022-11-24 DIAGNOSIS — R000 Tachycardia, unspecified: Secondary | ICD-10-CM

## 2022-11-24 DIAGNOSIS — I5042 Chronic combined systolic (congestive) and diastolic (congestive) heart failure: Secondary | ICD-10-CM | POA: Diagnosis not present

## 2022-11-24 DIAGNOSIS — I2510 Atherosclerotic heart disease of native coronary artery without angina pectoris: Secondary | ICD-10-CM

## 2022-11-24 DIAGNOSIS — E785 Hyperlipidemia, unspecified: Secondary | ICD-10-CM

## 2022-11-24 LAB — COMPREHENSIVE METABOLIC PANEL
ALT: 15 [IU]/L (ref 0–32)
AST: 22 [IU]/L (ref 0–40)
Albumin: 4.1 g/dL (ref 3.8–4.8)
Alkaline Phosphatase: 101 [IU]/L (ref 44–121)
BUN/Creatinine Ratio: 14 (ref 12–28)
BUN: 14 mg/dL (ref 8–27)
Bilirubin Total: 0.5 mg/dL (ref 0.0–1.2)
CO2: 22 mmol/L (ref 20–29)
Calcium: 9.4 mg/dL (ref 8.7–10.3)
Chloride: 105 mmol/L (ref 96–106)
Creatinine, Ser: 0.98 mg/dL (ref 0.57–1.00)
Globulin, Total: 2.5 g/dL (ref 1.5–4.5)
Glucose: 109 mg/dL — ABNORMAL HIGH (ref 70–99)
Potassium: 4.5 mmol/L (ref 3.5–5.2)
Sodium: 141 mmol/L (ref 134–144)
Total Protein: 6.6 g/dL (ref 6.0–8.5)
eGFR: 59 mL/min/{1.73_m2} — ABNORMAL LOW (ref >59)

## 2022-11-24 LAB — LIPID PANEL
Chol/HDL Ratio: 2.9 {ratio} (ref 0.0–4.4)
Cholesterol, Total: 186 mg/dL (ref 100–199)
HDL: 65 mg/dL (ref >39)
LDL Chol Calc (NIH): 103 mg/dL — ABNORMAL HIGH (ref 0–99)
Triglycerides: 102 mg/dL (ref 0–149)
VLDL Cholesterol Cal: 18 mg/dL (ref 5–40)

## 2022-11-24 NOTE — Patient Instructions (Signed)
Medication Instructions:  Your physician recommends that you continue on your current medications as directed. Please refer to the Current Medication list given to you today.   *If you need a refill on your cardiac medications before your next appointment, please call your pharmacy*  Lab Work: LP/CMET TODAY   If you have labs (blood work) drawn today and your tests are completely normal, you will receive your results only by: MyChart Message (if you have MyChart) OR A paper copy in the mail If you have any lab test that is abnormal or we need to change your treatment, we will call you to review the results.  Testing/Procedures: NONE  Follow-Up: At [facility], you and your health needs are our priority.  As part of our continuing mission to provide you with exceptional heart care, we have created designated Provider Care Teams.  These Care Teams include your primary Cardiologist (physician) and Advanced Practice Providers (APPs -  Physician Assistants and Nurse Practitioners) who all work together to provide you with the care you need, when you need it.  We recommend signing up for the patient portal called "MyChart".  Sign up information is provided on this After Visit Summary.  MyChart is used to connect with patients for Virtual Visits (Telemedicine).  Patients are able to view lab/test results, encounter notes, upcoming appointments, etc.  Non-urgent messages can be sent to your provider as well.   To learn more about what you can do with MyChart, go to [email or web address].    Your next appointment:   4 month(s)  Provider:   Gillian Shields, NP    Other Instructions IF YOU HAVE ANOTHER EPISODE OF PALPITATIONS USE YOUR KARDIA TO TRY TO CAPTURE

## 2022-11-27 ENCOUNTER — Telehealth (HOSPITAL_BASED_OUTPATIENT_CLINIC_OR_DEPARTMENT_OTHER): Payer: Self-pay

## 2022-11-27 ENCOUNTER — Encounter (HOSPITAL_BASED_OUTPATIENT_CLINIC_OR_DEPARTMENT_OTHER): Payer: Self-pay | Admitting: Cardiovascular Disease

## 2022-11-27 ENCOUNTER — Other Ambulatory Visit (HOSPITAL_BASED_OUTPATIENT_CLINIC_OR_DEPARTMENT_OTHER): Payer: Self-pay

## 2022-11-27 MED ORDER — ATORVASTATIN CALCIUM 40 MG PO TABS
40.0000 mg | ORAL_TABLET | Freq: Every day | ORAL | 3 refills | Status: DC
  Filled 2022-11-27: qty 90, 90d supply, fill #0

## 2022-11-27 NOTE — Addendum Note (Signed)
Addended by: Marlene Lard on: 11/27/2022 05:06 PM   Modules accepted: Orders

## 2022-11-27 NOTE — Telephone Encounter (Addendum)
Viewed by patient, follow up mychart message sent to patient.    Your cholesterol levels are too elevated.  Given that you do have some nonobstructive coronary artery disease, your LDL needs to be less than 70.  Recommend increasing atorvastatin to 40 mg.  Repeat lipids and a comprehensive metabolic panel in 2 to 3 months.

## 2022-12-11 ENCOUNTER — Other Ambulatory Visit (HOSPITAL_BASED_OUTPATIENT_CLINIC_OR_DEPARTMENT_OTHER): Payer: Self-pay

## 2023-02-14 ENCOUNTER — Emergency Department (HOSPITAL_BASED_OUTPATIENT_CLINIC_OR_DEPARTMENT_OTHER)
Admission: EM | Admit: 2023-02-14 | Discharge: 2023-02-14 | Disposition: A | Discharge status: Home / Self Care | Payer: Medicare Other | Attending: Emergency Medicine | Admitting: Emergency Medicine

## 2023-02-14 ENCOUNTER — Emergency Department (HOSPITAL_BASED_OUTPATIENT_CLINIC_OR_DEPARTMENT_OTHER): Payer: Medicare Other

## 2023-02-14 ENCOUNTER — Other Ambulatory Visit: Payer: Self-pay

## 2023-02-14 ENCOUNTER — Encounter (HOSPITAL_BASED_OUTPATIENT_CLINIC_OR_DEPARTMENT_OTHER): Payer: Self-pay | Admitting: Emergency Medicine

## 2023-02-14 DIAGNOSIS — I509 Heart failure, unspecified: Secondary | ICD-10-CM | POA: Diagnosis not present

## 2023-02-14 DIAGNOSIS — K449 Diaphragmatic hernia without obstruction or gangrene: Secondary | ICD-10-CM

## 2023-02-14 DIAGNOSIS — Z79899 Other long term (current) drug therapy: Secondary | ICD-10-CM | POA: Diagnosis not present

## 2023-02-14 DIAGNOSIS — I2510 Atherosclerotic heart disease of native coronary artery without angina pectoris: Secondary | ICD-10-CM | POA: Insufficient documentation

## 2023-02-14 DIAGNOSIS — R0789 Other chest pain: Secondary | ICD-10-CM | POA: Diagnosis present

## 2023-02-14 HISTORY — DX: Essential (primary) hypertension: I10

## 2023-02-14 LAB — CBC
HCT: 40.2 % (ref 36.0–46.0)
Hemoglobin: 12.8 g/dL (ref 12.0–15.0)
MCH: 28.3 pg (ref 26.0–34.0)
MCHC: 31.8 g/dL (ref 30.0–36.0)
MCV: 88.7 fL (ref 80.0–100.0)
Platelets: 286 10*3/uL (ref 150–400)
RBC: 4.53 MIL/uL (ref 3.87–5.11)
RDW: 14.4 % (ref 11.5–15.5)
WBC: 6.8 10*3/uL (ref 4.0–10.5)
nRBC: 0 % (ref 0.0–0.2)

## 2023-02-14 LAB — BASIC METABOLIC PANEL
Anion gap: 6 (ref 5–15)
BUN: 19 mg/dL (ref 8–23)
CO2: 27 mmol/L (ref 22–32)
Calcium: 8.8 mg/dL — ABNORMAL LOW (ref 8.9–10.3)
Chloride: 109 mmol/L (ref 98–111)
Creatinine, Ser: 0.7 mg/dL (ref 0.44–1.00)
GFR, Estimated: >60 mL/min (ref >60)
Glucose, Bld: 89 mg/dL (ref 70–99)
Potassium: 4.3 mmol/L (ref 3.5–5.1)
Sodium: 142 mmol/L (ref 135–145)

## 2023-02-14 LAB — TROPONIN I (HIGH SENSITIVITY)
Troponin I (High Sensitivity): 5 ng/L (ref <18)
Troponin I (High Sensitivity): 5 ng/L (ref <18)

## 2023-02-14 NOTE — ED Notes (Signed)
 Pt. Given peanut butter crackers and water.

## 2023-02-14 NOTE — Discharge Instructions (Signed)
Your chest discomfort may be due to your hiatal hernia.  Discussed this with your primary care doctor for outpatient management.  Your blood pressure initially was elevated but that has normalized.  Continue to take your blood pressure medication as previously prescribed.  Return if you have any concern.

## 2023-02-14 NOTE — ED Provider Notes (Signed)
[facility] Provider Note   CSN: [id number] Arrival date & time: 02/14/23  1237     History  No chief complaint on file.   Meagan Stout is a 77 y.o. female.  The history is provided by the patient and medical records. No language interpreter was used.     77 year old female significant history of CAD, GERD, anxiety, obesity, CHF presenting with complaints of chest discomfort.  Patient report over a month ago she had a follow-up appointment with her doctor due to increase in her sertraline.  During that visit it was noted that she has elevated blood pressure and patient was started on valsartan 40 mg daily.  Patient states for the past several weeks she noticed that her blood pressure has been elevated and she also complaining of having some vague chest pressure.  Yesterday, she endorsed a mild throbbing headache, not feeling well and having some chest pressure that persists throughout the day today.  She tries to follow-up with her PCP and her cardiologist but they were unavailable thus prompting this ER visit.  She has been monitoring her blood pressure at home with her blood pressure cuff and states that her blood pressure has been fluctuating wildly.  She is unsure if anything may have contributed to it.  She does admits to having some stress but not more than usual.  She denies any recent sickness or taking any other medication.  Her diet has been the same.  She does not endorse any focal numbness, focal weakness, shortness of breath, nausea, vomiting, diarrhea or diaphoresis.  She attributed her chest discomfort due to her hiatal hernia.  Home Medications Prior to Admission medications   Medication Sig Start Date End Date Taking? Authorizing Provider  atorvastatin (LIPITOR) 40 MG tablet Take 1 tablet (40 mg total) by mouth daily. 11/27/22   Chilton Si, MD  fluticasone [facility]) 50 MCG/ACT nasal spray Place 2 sprays into both nostrils  daily. 03/22/18   [provider]  glycopyrrolate (ROBINUL) 1 MG tablet Take 1 mg by mouth daily as needed (for IBS symptoms).     [provider]  levothyroxine (SYNTHROID) 25 MCG tablet Take 25 mcg by mouth daily. 09/02/21   [provider]  nitroGLYCERIN (NITROSTAT) 0.4 MG SL tablet Place 1 tablet (0.4 mg total) under the tongue every 5 (five) minutes x 3 doses as needed for chest pain. 04/12/18   [name] E, PA-C  pantoprazole (PROTONIX) 40 MG tablet Take 40 mg by mouth 2 (two) times daily. 01/03/18   [provider]  sertraline (ZOLOFT) 100 MG tablet Take 100 mg by mouth daily.    [provider]      Allergies    Patient has no known allergies.    Review of Systems   Review of Systems  All other systems reviewed and are negative.   Physical Exam Updated Vital Signs BP (!) 179/91   Pulse 81   Temp 97.8 F (36.6 C)   Resp 12   Wt 82.6 kg   SpO2 100%   BMI 27.51 kg/m  Physical Exam Vitals and nursing note reviewed.  Constitutional:      General: She is not in acute distress.    Appearance: She is well-developed.  HENT:     Head: Atraumatic.  Eyes:     Conjunctiva/sclera: Conjunctivae normal.  Cardiovascular:     Rate and Rhythm: Normal rate and regular rhythm.     Pulses: Normal pulses.  Heart sounds: Normal heart sounds.  Pulmonary:     Effort: Pulmonary effort is normal.     Breath sounds: No wheezing, rhonchi or rales.  Abdominal:     Palpations: Abdomen is soft.  Musculoskeletal:     Cervical back: Neck supple.     Right lower leg: No edema.     Left lower leg: No edema.  Skin:    Capillary Refill: Capillary refill takes less than 2 seconds.     Findings: No rash.  Neurological:     Mental Status: She is alert and oriented to person, place, and time.     GCS: GCS eye subscore is 4. GCS verbal subscore is 5. GCS motor subscore is 6.     Cranial Nerves: Cranial nerves 2-12 are intact.     Sensory:  Sensation is intact.     Motor: Motor function is intact.     Coordination: Coordination is intact.  Psychiatric:        Mood and Affect: Mood normal.     ED Results / Procedures / Treatments   Labs (all labs ordered are listed, but only abnormal results are displayed) Labs Reviewed  CBC  TROPONIN I (HIGH SENSITIVITY)  TROPONIN I (HIGH SENSITIVITY)    EKG EKG Interpretation Date/Time:  Wednesday February 14 2023 12:48:14 EST Ventricular Rate:  79 PR Interval:  138 QRS Duration:  112 QT Interval:  397 QTC Calculation: 456 R Axis:   -28  Text Interpretation: Sinus rhythm Probable left atrial enlargement Borderline intraventricular conduction delay No significant change since last tracing Confirmed by Linwood Dibbles [phone]) on 02/14/2023 12:54:38 PM  Radiology DG Chest Port 1 View Result Date: 02/14/2023 CLINICAL DATA:  Chest pain. EXAM: PORTABLE CHEST 1 VIEW COMPARISON:  AP chest 04/11/2018, chest two views 07/09/2003 FINDINGS: Cardiac silhouette and mediastinal contours with nodes. Moderate calcification within the aortic arch. Oval structure with internal air-fluid level again consistent with moderate sliding hiatal hernia. The lungs are clear. No pleural effusion or pneumothorax. Mild multilevel degenerative disc changes of the thoracic spine. IMPRESSION: 1. No active disease. 2. Moderate sliding hiatal hernia. Electronically Signed   By: Neita Garnet M.D.   On: 02/14/2023 14:31    Procedures Procedures    Medications Ordered in ED Medications - No data to display  ED Course/ Medical Decision Making/ A&P                                 Medical Decision Making Amount and/or Complexity of Data Reviewed Labs: ordered. Radiology: ordered.   BP 137/67   Pulse 69   Temp 97.8 F (36.6 C)   Resp 13   Wt 82.6 kg   SpO2 96%   BMI 27.51 kg/m   53:51 PM 77 year old female significant history of CAD, GERD, anxiety, obesity, CHF presenting with complaints of chest  discomfort.  Patient report over a month ago she had a follow-up appointment with her doctor due to increase in her sertraline.  During that visit it was noted that she has elevated blood pressure and patient was started on valsartan 40 mg daily.  Patient states for the past several weeks she noticed that her blood pressure has been elevated and she also complaining of having some vague chest pressure.  Yesterday, she endorsed a mild throbbing headache, not feeling well and having some chest pressure that persists throughout the day today.  She tries to follow-up with her  PCP and her cardiologist but they were unavailable thus prompting this ER visit.  She has been monitoring her blood pressure at home with her blood pressure cuff and states that her blood pressure has been fluctuating wildly.  She is unsure if anything may have contributed to it.  She does admits to having some stress but not more than usual.  She denies any recent sickness or taking any other medication.  Her diet has been the same.  She does not endorse any focal numbness, focal weakness, shortness of breath, nausea, vomiting, diarrhea or diaphoresis.  She attributed her chest discomfort due to her hiatal hernia.  She mention she has had chest discomfort in the past and was diagnosed with cardiomyopathy.  She has had a prior stress test with several nonobstructive heart vessels with plaques.  On exam, patient is laying bed appears slightly anxious however nontoxic.  Heart with normal rate and rhythm, lungs are clear to auscultation bilaterally abdomen is soft and nontender she does not have any focal neurodeficit and she is mentating appropriately.  Vitals are notable for elevated blood pressure of 194/101.  -Labs ordered, independently viewed and interpreted by me.  Labs remarkable for negative delta trop.  Electrolytes are reassuring -The patient was maintained on a cardiac monitor.  I personally viewed and interpreted the cardiac  monitored which showed an underlying rhythm of: NSR -Imaging independently viewed and interpreted by me and I agree with radiologist's interpretation.  Result remarkable for CXR showing moderate sliding hiatal hernia, which may account to pt's chest discomfort -This patient presents to the ED for concern of cp, this involves an extensive number of treatment options, and is a complaint that carries with it a high risk of complications and morbidity.  The differential diagnosis includes hiatal hernia, MI, ACS, PNA, PTX, dissection, MSK, GERD, gastritis -Co morbidities that complicate the patient evaluation includes hernia, cardiomyopathy -Treatment includes monitoring -Reevaluation of the patient after these medicines showed that the patient improved -PCP office notes or outside notes reviewed -Discussion with attending Dr. Lynelle Doctor -Escalation to admission/observation considered: patients feels much better, is comfortable with discharge, and will follow up with PCP -Prescription medication considered, patient comfortable with home medication including BP medication -Social Determinant of Health considered   Blood pressure normalized despite not taking any additional blood pressure medication.  Encourage patient to follow-up closely with PCP for outpatient evaluation and management and gave patient return precaution.         Final Clinical Impression(s) / ED Diagnoses Final diagnoses:  Hiatal hernia    Rx / DC Orders ED Discharge Orders     None         Fayrene Helper, PA-C 02/14/23 1605    Linwood Dibbles, MD 02/15/23 [phone]

## 2023-02-14 NOTE — ED Triage Notes (Signed)
Pt reports "my blood pressure was high" x 3 weeks, also endorses CP that she reports noticing yesterday. Denies shob. Took 40mg  valsartan today

## 2023-02-16 ENCOUNTER — Telehealth (HOSPITAL_BASED_OUTPATIENT_CLINIC_OR_DEPARTMENT_OTHER): Payer: Self-pay | Admitting: Cardiovascular Disease

## 2023-02-16 NOTE — Telephone Encounter (Signed)
  Pt sent a MyChart message through patient schedule request:  On routine visit with PCP Dr Loreen Freud noted BP up & started 1x day 40 Valsarten. BP was high & had ache in chest & back - 12/18 I went ED. All ok. I would like to have Dr Duke Salvia be aware. Do I need to be seen before Jan 27 which is when I already had a follow up appt with NPractioner. I am taking BP daily & feel concerned it can be so high & then fairly low in a given day. You could see EKG, labs & 4 hrs of BP numbers from hospital visit 12/18/2024Advise if need apt before scheduled 1/27   Please advise

## 2023-02-16 NOTE — Telephone Encounter (Signed)
ED visit reviewed, no acute findings. From ED note, "Blood pressure normalized despite not taking any additional blood pressure medication.  Encourage patient to follow-up closely with PCP for outpatient evaluation and management and gave patient return precaution.". BP in ED initially 1250 194/101 ? 1300 179/91 ?  1500 137/63 ?  1609 119/60. Treatment for HTN is new (was not on antihypertensives when seen 10/2022).   Ensure checking BP once per day at least an hour after her medications. Ensure sitting to rest 5-10 minutes prior to checking. She can call us back in a week with readings but as her PCP is also following closely I think follow up as presently scheduled is reasonable.   Alver Sorrow, NP

## 2023-02-16 NOTE — Telephone Encounter (Signed)
Left message to call back.       Mychart message sent as well

## 2023-02-19 NOTE — Telephone Encounter (Signed)
See my chart response .

## 2023-02-23 NOTE — Telephone Encounter (Signed)
Patient read my chart message

## 2023-03-26 ENCOUNTER — Encounter (HOSPITAL_BASED_OUTPATIENT_CLINIC_OR_DEPARTMENT_OTHER): Payer: Self-pay | Admitting: Family

## 2023-03-26 ENCOUNTER — Ambulatory Visit (HOSPITAL_BASED_OUTPATIENT_CLINIC_OR_DEPARTMENT_OTHER): Payer: Medicare Other | Admitting: Family

## 2023-03-26 VITALS — BP 120/64 | Ht 68.0 in | Wt 181.2 lb

## 2023-03-26 DIAGNOSIS — I25118 Atherosclerotic heart disease of native coronary artery with other forms of angina pectoris: Secondary | ICD-10-CM

## 2023-03-26 DIAGNOSIS — E785 Hyperlipidemia, unspecified: Secondary | ICD-10-CM | POA: Diagnosis not present

## 2023-03-26 DIAGNOSIS — I10 Essential (primary) hypertension: Secondary | ICD-10-CM

## 2023-03-26 MED ORDER — VALSARTAN 40 MG PO TABS: 20.0000 mg | ORAL_TABLET | Freq: Every day | ORAL | Status: DC

## 2023-03-26 MED ORDER — HYDRALAZINE HCL 10 MG PO TABS: 10.0000 mg | ORAL_TABLET | Freq: Two times a day (BID) | ORAL | 2 refills | Status: DC | PRN

## 2023-03-26 NOTE — Patient Instructions (Addendum)
Medication Instructions:   CHANGE Valsartan to half tablet (20mg ) daily  START Hydralazine as needed for systolic blood pressure (the top number) more than 180. You may take this up to twice daily  *If you need a refill on your cardiac medications before your next appointment, please call your pharmacy*  Testing/Procedures: We discussed a possible cardiac CTA to look at your heart arteries. Cardiac computed tomography (CT) is a painless test that uses an x-ray machine to take clear, detailed pictures of your heart.   Follow-Up: At [facility], you and your health needs are our priority.  As part of our continuing mission to provide you with exceptional heart care, we have created designated Provider Care Teams.  These Care Teams include your primary Cardiologist (physician) and Advanced Practice Providers (APPs -  Physician Assistants and Nurse Practitioners) who all work together to provide you with the care you need, when you need it.  We recommend signing up for the patient portal called "MyChart".  Sign up information is provided on this After Visit Summary.  MyChart is used to connect with patients for Virtual Visits (Telemedicine).  Patients are able to view lab/test results, encounter notes, upcoming appointments, etc.  Non-urgent messages can be sent to your provider as well.   To learn more about what you can do with MyChart, go to [email or web address].    Your next appointment:   2 month(s)  Provider:   Chilton Si, MD or Gillian Shields, NP    Other Instructions  To prevent palpitations: Make sure you are adequately hydrated.  Avoid and/or limit caffeine containing beverages like soda or tea. Exercise regularly.  Manage stress well. Some over the counter medications can cause palpitations such as Benadryl, AdvilPM, TylenolPM. Regular Advil or Tylenol do not cause palpitations.

## 2023-03-26 NOTE — Progress Notes (Signed)
Cardiology Office Note:  .   Date:  03/26/2023  ID:  Meagan Stout, DOB 1945-11-24, MRN [id number] PCP: Lewis Moccasin, MD  [facility] Providers Cardiologist:  Chilton Si, MD    History of Present Illness: .   Meagan Stout is a 78 y.o. female with history of GERD, hiatal hernia s/p repair in 2002, anxiety, prior tobacco use, nonobstructive CAD, hyperlipidemia  Admitted 03/2018 with chest pain and elevated cardiac enzymes.  Echo 04/12/2018 normal LVEF 55 to 60%.  LHC with LVEF 45 to 50% with apical hypokinesis suggestive of stress-induced cardiomyopathy with mild nonobstructive CAD.  Beta-blocker previously discontinued due to hypotension.  Does not tolerate ACE or ARB.  Prior Kardia mobile strip with sinus tachycardia. 3 day ZIO with NSR with 1.5% PAC, triggered episodes associated with NSR.   Last seen 10/2022 with intermittent palpitations, she politely declined Metoprolol as she felt stress was contributory. As LDL elevated, recommended to increase Atorvastatin to 40mg .   PCP has started Valsartan 40mg  for blood pressure control. She was seen in ED 02/14/23 with hiatal hernia, BP normalized despite not taking any additional medication. BP in ED 1250 194/101 ? 1300 179/91 ?  1500 137/63 ?  1609 119/60.   Presents today for follow up. While sitting had 7 "spasms" in mid chest that felt similar to her 2020 episode of cardiomyopathy. This was in setting of stress but while at rest. Her blood pressure was high but heart rate did not escalate. Notes her prior hiatal hernia has been "coming apart" and is following with Dr. Ewing Schlein. She did endoscopy last week and was told it looked good. She cannot locate her Kardia at home at this time. She does feel more fatigued when her blood pressure is low. Her PCP did suggest reducing her Valsartan to half tablet but has not yet done so. She drinks protein drink and half cup of coffee in the morning, sandwich/soup at lunch, meat and veggie for  dinner. Drinks four or five 10 oz glasses of water.   ROS: Please see the history of present illness.    All other systems reviewed and are negative.   Studies Reviewed: .        Cardiac Studies & Procedures   CARDIAC CATHETERIZATION  CARDIAC CATHETERIZATION 04/12/2018  Narrative  Mid RCA lesion is 30% stenosed.  There is mild left ventricular systolic dysfunction.  LV end diastolic pressure is normal.  The left ventricular ejection fraction is 45-50% by visual estimate.  1st Mrg lesion is 20% stenosed.  Prox LAD lesion is 20% stenosed.  1.  Mild nonobstructive coronary artery disease. 2.  Mildly reduced LV systolic function with an EF of 45% with apical hypokinesis suggestive of stress-induced cardiomyopathy. 3.  High normal left ventricular end-diastolic pressure.  Recommendations: I do not see any culprit for non-ST elevation myocardial infarction.  Wall motion abnormalities suggestive of stress-induced cardiomyopathy.  Recommend medical therapy.  Obtain an echocardiogram.  Findings Coronary Findings Diagnostic  Dominance: Right  Left Main Vessel is angiographically normal.  Left Anterior Descending Prox LAD lesion is 20% stenosed.  Left Circumflex Vessel is angiographically normal.  First Obtuse Marginal Branch 1st Mrg lesion is 20% stenosed.  Second Obtuse Marginal Branch Vessel is angiographically normal.  Right Coronary Artery Mid RCA lesion is 30% stenosed.  Intervention  No interventions have been documented.    ECHOCARDIOGRAM  ECHOCARDIOGRAM COMPLETE 08/25/2020  Narrative ECHOCARDIOGRAM REPORT    Patient Name:   Meagan Stout Date  of Exam: 08/25/2020 Medical Rec #:  [id number]     Height:       69.0 in Accession #:    [id number]    Weight:       198.0 lb Date of Birth:  [birth year]      BSA:          2.057 m Patient Age:    74 years      BP:           116/77 mmHg Patient Gender: F             HR:           77 bpm. 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 Nonobstructive CAD; Signs/Symptoms:Palpitations.  Sonographer:    Clearence Ped RCS Referring Phys: [id number] TIFFANY Lynch  IMPRESSIONS   1. Left ventricular ejection fraction, by estimation, is 60 to 65%. The left ventricle has normal function. The left ventricle has no regional wall motion abnormalities. Left ventricular diastolic parameters are consistent with Grade I diastolic dysfunction (impaired relaxation). 2. Right ventricular systolic function is normal. The right ventricular size is normal. There is normal pulmonary artery systolic pressure. The estimated right ventricular systolic pressure is 32.4 mmHg. 3. The mitral valve is grossly normal. Trivial mitral valve regurgitation. 4. The aortic valve is tricuspid. Aortic valve regurgitation is trivial. Aortic regurgitation PHT measures 614 msec. 5. The inferior vena cava is normal in size with greater than 50% respiratory variability, suggesting right atrial pressure of 3 mmHg.  Comparison(s): Changes from prior study are noted. 04/12/2018: LVEF 55-60%, mild LAE.  FINDINGS Left Ventricle: Left ventricular ejection fraction, by estimation, is 60 to 65%. The left ventricle has normal function. The left ventricle has no regional wall motion abnormalities. The left ventricular internal cavity size was normal in size. There is no left ventricular hypertrophy. Left ventricular diastolic parameters are consistent with Grade I diastolic dysfunction (impaired relaxation). Indeterminate filling pressures.  Right Ventricle: The right ventricular size is normal. No increase in right ventricular wall thickness. Right ventricular systolic function is normal. There is normal pulmonary artery systolic pressure.  The tricuspid regurgitant velocity is 2.71 m/s, and with an assumed right atrial pressure of 3 mmHg, the estimated right ventricular systolic pressure is 32.4 mmHg.  Left Atrium: Left atrial size was normal in size.  Right Atrium: Right atrial size was normal in size.  Pericardium: There is no evidence of pericardial effusion.  Mitral Valve: The mitral valve is grossly normal. Trivial mitral valve regurgitation.  Tricuspid Valve: The tricuspid valve is grossly normal. Tricuspid valve regurgitation is trivial.  Aortic Valve: The aortic valve is tricuspid. Aortic valve regurgitation is trivial. Aortic regurgitation PHT measures 614 msec.  Pulmonic Valve: The pulmonic valve was grossly normal. Pulmonic valve regurgitation is trivial.  Aorta: The aortic root and ascending aorta are structurally normal, with no evidence of dilitation.  Venous: The inferior vena cava is normal in size with greater than 50% respiratory variability, suggesting right atrial pressure of 3 mmHg.  IAS/Shunts: No atrial level shunt detected by color flow Doppler.   LEFT VENTRICLE PLAX 2D LVIDd:         4.50 cm  Diastology LVIDs:         3.30 cm  LV e' medial:    3.15 cm/s LV PW:         0.80 cm  LV E/e' medial:  18.0 LV IVS:        0.70 cm  LV e' lateral:   8.70 cm/s LVOT diam:     1.90 cm  LV E/e' lateral: 6.5 LV SV:         62 LV SV Index:   30 LVOT Area:     2.84 cm   RIGHT VENTRICLE RV Basal diam:  3.30 cm RV S prime:     18.10 cm/s TAPSE (M-mode): 2.4 cm RVSP:           32.4 mmHg  LEFT ATRIUM             Index       RIGHT ATRIUM           Index LA diam:        3.40 cm 1.65 cm/m  RA Pressure: 3.00 mmHg LA Vol (A2C):   25.6 ml 12.44 ml/m RA Area:     10.60 cm LA Vol (A4C):   30.7 ml 14.92 ml/m RA Volume:   21.60 ml  10.50 ml/m LA Biplane Vol: 28.7 ml 13.95 ml/m AORTIC VALVE LVOT Vmax:   107.00 cm/s LVOT Vmean:  64.800 cm/s LVOT VTI:    0.219 m AI PHT:      614 msec  AORTA Ao Root  diam: 3.10 cm Ao Asc diam:  3.00 cm  MITRAL VALVE               TRICUSPID VALVE MV Area (PHT):             TR Peak grad:   29.4 mmHg MV Decel Time:             TR Vmax:        271.00 cm/s MV E velocity: 56.60 cm/s  Estimated RAP:  3.00 mmHg MV A velocity: 84.80 cm/s  RVSP:           32.4 mmHg MV E/A ratio:  0.67 SHUNTS Systemic VTI:  0.22 m Systemic Diam: 1.90 cm  Zoila Shutter MD Electronically signed by Zoila Shutter MD Signature Date/Time: 08/25/2020/3:14:23 PM    Final   MONITORS  LONG TERM MONITOR (3-14 DAYS) 09/22/2021  Narrative 3 Day Zio Monitor  Quality: Fair.  Baseline artifact. Predominant rhythm: sinus rhythm Average heart rate: 74 bpm Max heart rate: 139 bpm Min heart rate: 51 bpm Pauses >2.5 seconds: none Occasional PACs (1.5%) Rare PVCs and ventricular couplets  Tiffany C. Duke Salvia, MD, [facility] 10/26/2021 8:19 AM           Risk Assessment/Calculations:             Physical Exam:   VS:  BP 120/64 (BP Location: Left Arm, Patient Position: Sitting, Cuff Size: Small)   Ht 5\' 8"  (1.727 m)   Wt 181 lb 3.2 oz (82.2 kg)   BMI 27.55 kg/m    Wt Readings from Last 3 Encounters:  03/26/23 181 lb 3.2 oz (82.2 kg)  02/14/23 182 lb (82.6 kg)  11/24/22 190 lb 6.4 oz (86.4 kg)    GEN: Well nourished, well developed in no acute distress NECK: No JVD; No carotid bruits CARDIAC: RRR, no murmurs, rubs, gallops RESPIRATORY:  Clear to auscultation without rales, wheezing or rhonchi  ABDOMEN: Soft, non-tender, non-distended EXTREMITIES:  No edema; No deformity   ASSESSMENT AND PLAN: .    Episodic tachycardia -  Discussed PRN Metoprolol, she prefers to monitor symptoms. Encouraged to stay well hydrated, manage stress well.  HTN - BP controlled in clinic but home readings routinely hypotensive. Reduce Valsartan to 20mg  daily. Rx Hydralazine 10mg  PRN for SBP  >180. Discussed to monitor BP at home at least 2 hours after medications and sitting for 5-10 minutes.  Recommended she only check 1-2x per day as she has been checking 4-5x per day.   Nonobstructive CAD / HLD, LDL goal < 70 - Isolated episode of chest pain at rest. EKG today reassuring. Discussed possible cardiac CTA, as isolated episode she prefers to defer additional testing today and focus on BP.       Dispo: follow up in 2 mos  Signed, Alver Sorrow, NP

## 2023-06-08 ENCOUNTER — Ambulatory Visit (HOSPITAL_BASED_OUTPATIENT_CLINIC_OR_DEPARTMENT_OTHER): Payer: Medicare Other | Admitting: Family

## 2023-06-08 VITALS — BP 112/62 | HR 87 | Ht 68.0 in | Wt 183.0 lb

## 2023-06-08 DIAGNOSIS — I10 Essential (primary) hypertension: Secondary | ICD-10-CM | POA: Diagnosis not present

## 2023-06-08 DIAGNOSIS — E785 Hyperlipidemia, unspecified: Secondary | ICD-10-CM

## 2023-06-08 DIAGNOSIS — I2510 Atherosclerotic heart disease of native coronary artery without angina pectoris: Secondary | ICD-10-CM | POA: Diagnosis not present

## 2023-06-08 NOTE — Patient Instructions (Signed)
 Medication Instructions:  Your physician has recommended you make the following change in your medication:   STOP Valsartan  *If you need a refill on your cardiac medications before your next appointment, please call your pharmacy*  Testing/Procedures: Your physician has requested that you have an echocardiogram. Echocardiography is a painless test that uses sound waves to create images of your heart. It provides your doctor with information about the size and shape of your heart and how well your heart's chambers and valves are working. This procedure takes approximately one hour. There are no restrictions for this procedure. Please do NOT wear cologne, perfume, aftershave, or lotions (deodorant is allowed). Please arrive 15 minutes prior to your appointment time.  Please note: We ask at that you not bring children with you during ultrasound (echo/ vascular) testing. Due to room size and safety concerns, children are not allowed in the ultrasound rooms during exams. Our front office staff cannot provide observation of children in our lobby area while testing is being conducted. An adult accompanying a patient to their appointment will only be allowed in the ultrasound room at the discretion of the ultrasound technician under special circumstances. We apologize for any inconvenience.   Your physician has requested a 24 hour blood pressure cuff.  Follow-Up: At [facility], you and your health needs are our priority.  As part of our continuing mission to provide you with exceptional heart care, our providers are all part of one team.  This team includes your primary Cardiologist (physician) and Advanced Practice Providers or APPs (Physician Assistants and Nurse Practitioners) who all work together to provide you with the care you need, when you need it.  Your next appointment:   2 to 3 month(s)  Provider:   Chilton Si, MD or Gillian Shields, NP    We recommend signing up for the  patient portal called "MyChart".  Sign up information is provided on this After Visit Summary.  MyChart is used to connect with patients for Virtual Visits (Telemedicine).  Patients are able to view lab/test results, encounter notes, upcoming appointments, etc.  Non-urgent messages can be sent to your provider as well.   To learn more about what you can do with MyChart, go to [email or web address].

## 2023-06-08 NOTE — Progress Notes (Signed)
 Cardiology Office Note:  .   Date:  06/08/2023  ID:  Meagan Stout, DOB [birth year], MRN [id number] PCP: Lewis Moccasin, MD  [facility] Providers Cardiologist:  Chilton Si, MD    History of Present Illness: .   Meagan Stout is a 78 y.o. female with history of GERD, hiatal hernia s/p repair in 2002, anxiety, prior tobacco use, nonobstructive CAD, hyperlipidemia  Admitted 03/2018 with chest pain and elevated cardiac enzymes.  Echo 04/12/2018 normal LVEF 55 to 60%.  LHC with LVEF 45 to 50% with apical hypokinesis suggestive of stress-induced cardiomyopathy with mild nonobstructive CAD.  Beta-blocker previously discontinued due to hypotension.  Does not tolerate ACE or ARB.  Prior Kardia mobile strip with sinus tachycardia. 3 day ZIO with NSR with 1.5% PAC, triggered episodes associated with NSR.   Seen 10/2022 with intermittent palpitations, she politely declined Metoprolol as she felt stress was contributory. As LDL elevated, recommended to increase Atorvastatin to 40mg .   PCP has started Valsartan 40mg  for blood pressure control. She was seen in ED 02/14/23 with hiatal hernia, BP normalized despite not taking any additional medication. BP in ED 1250 194/101 ? 1300 179/91 ?  1500 137/63 ?  1609 119/60.   At last visit PRN Metoprolol discussed for palpitations, she preferred to monitor symptoms. Home readings hypotensive, Valsartan reduced to 20mg  daily. Encourged to only check BP once or twice per day. She had isolated episode of chest pain at rest with unremarkable EKG, cardiac CTA discussed but as isolated episode deferred.  Presents today for follow up independently, cconcerned about her blood pressure. Since last seen continues to drink four or five 10 oz glasses of water per day and has increased protein intake with improvement in sensation of blood sugar dropping. Her PCP stopped Valsartan 20mg  on 04/11/23 due to hypotensive. She was recommended to start Carvedilol 3.125mg  BID  last week by her PCP but has not yet done so. Her average blood pressure over the last month on home arm cuff was 133/86 with average BP over last week 130/85. BP range over the last month 83/66-174/92. Feels poorly with hypotensive readings. . She has not had any significant palpitations. She has not had any recurrent chest spasm since her prior episode in December.    ROS: Please see the history of present illness.    All other systems reviewed and are negative.   Studies Reviewed: .           Risk Assessment/Calculations:             Physical Exam:   VS:  BP 112/62   Pulse 87   Ht 5\' 8"  (1.727 m)   Wt 183 lb (83 kg)   SpO2 98%   BMI 27.83 kg/m    Wt Readings from Last 3 Encounters:  06/08/23 183 lb (83 kg)  03/26/23 181 lb 3.2 oz (82.2 kg)  02/14/23 182 lb (82.6 kg)    GEN: Well nourished, well developed in no acute distress NECK: No JVD; No carotid bruits CARDIAC: RRR, no murmurs, rubs, gallops RESPIRATORY:  Clear to auscultation without rales, wheezing or rhonchi  ABDOMEN: Soft, non-tender, non-distended EXTREMITIES:  No edema; No deformity   ASSESSMENT AND PLAN: .    Episodic tachycardia - Symptoms quiescent. If more bothersome in future, consider PRN Metoprolol.  HTN - BP controlled in clinic but home readings variable. Valsartan previously stopped for hypotension. Continue Hydralazine 10mg  PRN for SBP  >180. Her average blood pressure over  the last month on home arm cuff was 133/86 with average BP over last week 130/85. BP range over the last month 83/66-174/92.  Discussed to monitor BP at home at least 2 hours after medications and sitting for 5-10 minutes. Recommended she only check 1-2x per day as she has been checking 4-5x per day.  Plan for 24h BP monitor to assess true BP control. If BP not at goal <130/80, consider low dose Coreg 3.125mg  BID as previously recommended by her PCP.  Plan for echocardiogram to assess for structural abnormality contributory to labile  BP  Nonobstructive CAD / HLD, LDL goal < 70 - Stable with no anginal symptoms. No indication for ischemic evaluation.  GDMT atorvastatin 40mg  daily. Recommend aiming for 150 minutes of moderate intensity activity per week and following a heart healthy diet.         Dispo: follow up in 2-3 mos  Signed, Clearnce Curia, NP

## 2023-06-09 ENCOUNTER — Encounter (HOSPITAL_BASED_OUTPATIENT_CLINIC_OR_DEPARTMENT_OTHER): Payer: Self-pay | Admitting: Family

## 2023-06-11 ENCOUNTER — Other Ambulatory Visit (HOSPITAL_BASED_OUTPATIENT_CLINIC_OR_DEPARTMENT_OTHER): Payer: Self-pay | Admitting: Family

## 2023-06-11 DIAGNOSIS — E785 Hyperlipidemia, unspecified: Secondary | ICD-10-CM

## 2023-06-11 DIAGNOSIS — I10 Essential (primary) hypertension: Secondary | ICD-10-CM

## 2023-06-11 DIAGNOSIS — I2510 Atherosclerotic heart disease of native coronary artery without angina pectoris: Secondary | ICD-10-CM

## 2023-06-11 DIAGNOSIS — I959 Hypotension, unspecified: Secondary | ICD-10-CM

## 2023-06-11 DIAGNOSIS — I952 Hypotension due to drugs: Secondary | ICD-10-CM

## 2023-06-22 ENCOUNTER — Other Ambulatory Visit (HOSPITAL_BASED_OUTPATIENT_CLINIC_OR_DEPARTMENT_OTHER): Payer: Self-pay | Admitting: Family

## 2023-06-22 DIAGNOSIS — I10 Essential (primary) hypertension: Secondary | ICD-10-CM

## 2023-06-28 ENCOUNTER — Ambulatory Visit (HOSPITAL_BASED_OUTPATIENT_CLINIC_OR_DEPARTMENT_OTHER)

## 2023-06-28 ENCOUNTER — Encounter (HOSPITAL_BASED_OUTPATIENT_CLINIC_OR_DEPARTMENT_OTHER): Payer: Self-pay

## 2023-06-28 DIAGNOSIS — I10 Essential (primary) hypertension: Secondary | ICD-10-CM

## 2023-06-28 LAB — ECHOCARDIOGRAM COMPLETE
AR max vel: 2.19 cm2
AV Area VTI: 2.31 cm2
AV Area mean vel: 2.32 cm2
AV Mean grad: 3 mmHg
AV Peak grad: 6.5 mmHg
Ao pk vel: 1.27 m/s
Area-P 1/2: 3.79 cm2
S' Lateral: 3.23 cm

## 2023-06-29 ENCOUNTER — Encounter (HOSPITAL_BASED_OUTPATIENT_CLINIC_OR_DEPARTMENT_OTHER): Payer: Self-pay

## 2023-07-06 ENCOUNTER — Encounter (HOSPITAL_BASED_OUTPATIENT_CLINIC_OR_DEPARTMENT_OTHER): Payer: Self-pay

## 2023-07-06 NOTE — Telephone Encounter (Signed)
Addressed via separate mychart encounter.   Alver Sorrow, NP

## 2023-09-18 ENCOUNTER — Ambulatory Visit (HOSPITAL_BASED_OUTPATIENT_CLINIC_OR_DEPARTMENT_OTHER): Admitting: Family

## 2023-11-15 ENCOUNTER — Encounter (HOSPITAL_BASED_OUTPATIENT_CLINIC_OR_DEPARTMENT_OTHER): Payer: Self-pay

## 2023-12-10 ENCOUNTER — Encounter (HOSPITAL_BASED_OUTPATIENT_CLINIC_OR_DEPARTMENT_OTHER): Payer: Self-pay | Admitting: Family

## 2023-12-10 ENCOUNTER — Ambulatory Visit (INDEPENDENT_AMBULATORY_CARE_PROVIDER_SITE_OTHER): Admitting: Family

## 2023-12-10 VITALS — BP 114/74 | HR 77 | Ht 68.0 in | Wt 180.0 lb

## 2023-12-10 DIAGNOSIS — I2510 Atherosclerotic heart disease of native coronary artery without angina pectoris: Secondary | ICD-10-CM | POA: Diagnosis not present

## 2023-12-10 DIAGNOSIS — I10 Essential (primary) hypertension: Secondary | ICD-10-CM

## 2023-12-10 DIAGNOSIS — E785 Hyperlipidemia, unspecified: Secondary | ICD-10-CM | POA: Diagnosis not present

## 2023-12-10 NOTE — Progress Notes (Signed)
 Cardiology Office Note:  .   Date:  12/10/2023  ID:  Meagan Stout, DOB February 19, 1946, MRN [id number] PCP: Waylan Almarie SAUNDERS, MD  [facility] Providers Cardiologist:  Annabella Scarce, MD    History of Present Illness: .   Meagan Stout is a 78 y.o. female with history of GERD, hiatal hernia s/p repair in 2002, anxiety, prior tobacco use, nonobstructive CAD, hyperlipidemia  Admitted 03/2018 with chest pain and elevated cardiac enzymes.  Echo 04/12/2018 normal LVEF 55 to 60%.  LHC with LVEF 45 to 50% with apical hypokinesis suggestive of stress-induced cardiomyopathy with mild nonobstructive CAD.  Beta-blocker previously discontinued due to hypotension.  Does not tolerate ACE or ARB.  Prior Kardia mobile strip with sinus tachycardia. 3 day ZIO with NSR with 1.5% PAC, triggered episodes associated with NSR.   Seen 10/2022 with intermittent palpitations, she politely declined Metoprolol  as she felt stress was contributory. As LDL elevated, recommended to increase Atorvastatin  to 40mg .   PCP has started Valsartan  40mg  for blood pressure control. She was seen in ED 02/14/23 with hiatal hernia, BP normalized despite not taking any additional medication. BP in ED 1250 194/101 ? 1300 179/91 ?  1500 137/63 ?  1609 119/60.   At last visit PRN Metoprolol  discussed for palpitations, she preferred to monitor symptoms. Home readings hypotensive, Valsartan  reduced to 20mg  daily. Encourged to only check BP once or twice per day. She had isolated episode of chest pain at rest with unremarkable EKG, cardiac CTA discussed but as isolated episode deferred.  At visit 06/08/2023 her episodic tachycardia was quiescent.  Her BP was controlled in clinic but variable at home.  24-hour blood pressure monitor and echocardiogram recommended.  She was advised to remain off valsartan  due to hypotension.  24-hour BP monitor not completed.  Echo 06/28/2023 normal LVEF 60 to 65%, RV mildly enlarged, mild AI.  She sent  in BP readings via MyChart 06/2023 with average BP less than 130/80 over the prior 3 months and recommended to remain off valsartan .  She brings her BP cuff for review. Her BP at home is routinely 110-120s/70-80s with rare outlier 90/60 or 140/90. Reports rare episodes tachycardia which are short lived and self resolved. More recent issus with hip pain down her leg with MRI revealing back etiology, she had steroid shot in her knees which did help. She is back to her usual exercise routine. 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 Follows with Dr. Rosalie of GI. She notes an ache in her midchest on occasion, most recently 1 month ago while sleeping. She is uncertain if cardiac or related to GERD. Discussed overall atypical for angina and she was reassured.   ROS: Please see the history of present illness.    All other systems reviewed and are negative.   Studies Reviewed: .        Cardiac Studies & Procedures   ______________________________________________________________________________________________ CARDIAC CATHETERIZATION  CARDIAC CATHETERIZATION 04/12/2018  Conclusion  Mid RCA lesion is 30% stenosed.  There is mild left ventricular systolic dysfunction.  LV end diastolic pressure is normal.  The left ventricular ejection fraction is 45-50% by visual estimate.  1st Mrg lesion is 20% stenosed.  Prox LAD lesion is 20% stenosed.  1.  Mild nonobstructive coronary artery disease. 2.  Mildly reduced LV systolic function with an EF of 45% with apical hypokinesis suggestive of stress-induced cardiomyopathy. 3.  High normal left ventricular end-diastolic pressure.  Recommendations:  I do not see any culprit for non-ST elevation myocardial infarction.  Wall motion abnormalities suggestive of stress-induced cardiomyopathy.  Recommend medical therapy.  Obtain an echocardiogram.  Findings Coronary Findings Diagnostic  Dominance:  Right  Left Main Vessel is angiographically normal.  Left Anterior Descending Prox LAD lesion is 20% stenosed.  Left Circumflex Vessel is angiographically normal.  First Obtuse Marginal Branch 1st Mrg lesion is 20% stenosed.  Second Obtuse Marginal Branch Vessel is angiographically normal.  Right Coronary Artery Mid RCA lesion is 30% stenosed.  Intervention  No interventions have been documented.     ECHOCARDIOGRAM  ECHOCARDIOGRAM COMPLETE 06/28/2023  Narrative ECHOCARDIOGRAM REPORT    Patient Name:   Meagan Stout Date of Exam: 06/28/2023 Medical Rec #:  [id number]     Height:       68.0 in Accession #:    [id number]    Weight:       183.0 lb Date of Birth:  Dec 18, 1945      BSA:          1.968 m Patient Age:    77 years      BP:           112/62 mmHg Patient Gender: F             HR:           71 bpm. Exam Location:  Outpatient  Procedure: 2D Echo, Cardiac Doppler, Color Doppler and 3D Echo (Both Spectral and Color Flow Doppler were utilized during procedure).  Indications:    I50.40* Unspecified combined systolic (congestive) and diastolic (congestive) heart failure  History:        Patient has prior history of Echocardiogram examinations, most recent 08/25/2020. CHF, CAD; Risk Factors:Dyslipidemia and Former Smoker. Patient denies chest pain, SOB and leg edema. She suffers from GERD and hiatal hernia symptoms.  Sonographer:    Annabella Cater RVT, RDCS (AE), RDMS Referring Phys: [id number] Ferrell Claiborne S Khylen Riolo  IMPRESSIONS   1. Left ventricular ejection fraction, by estimation, is 60 to 65%. The left ventricle has normal function. The left ventricle has no regional wall motion abnormalities. Left ventricular diastolic parameters are consistent with Grade I diastolic dysfunction (impaired relaxation). 2. Normal RV Free wall strain. Right ventricular systolic function is normal. The right ventricular size is mildly enlarged. 3. The mitral valve is normal in structure.  Trivial mitral valve regurgitation. No evidence of mitral stenosis. 4. Commissural, mild aortic regurgitation between the left and right cusp. The aortic valve is tricuspid. Aortic valve regurgitation is mild. No aortic stenosis is present.  Comparison(s): Prior images reviewed side by side. RV has dilated, AI was not as well imaged in prior study.  FINDINGS Left Ventricle: No LV Strain transmitted. Left ventricular ejection fraction, by estimation, is 60 to 65%. The left ventricle has normal function. The left ventricle has no regional wall motion abnormalities. Strain was performed and the global longitudinal strain is indeterminate. 3D ejection fraction reviewed and evaluated as part of the interpretation. Alternate measurement of EF is felt to be most reflective of LV function. The left ventricular internal cavity size was normal in size. There is no left ventricular hypertrophy. Left ventricular diastolic parameters are consistent with Grade I diastolic dysfunction (impaired relaxation).  Right Ventricle: Normal RV Free wall strain. The right ventricular size is mildly enlarged. No increase in right ventricular wall thickness. Right ventricular systolic function is normal.  Left Atrium: Left atrial size was normal in size.  Right Atrium: Right atrial  size was normal in size.  Pericardium: There is no evidence of pericardial effusion. Presence of epicardial fat layer.  Mitral Valve: The mitral valve is normal in structure. Trivial mitral valve regurgitation. No evidence of mitral valve stenosis.  Tricuspid Valve: The tricuspid valve is normal in structure. Tricuspid valve regurgitation is trivial. No evidence of tricuspid stenosis.  Aortic Valve: Commissural, mild aortic regurgitation between the left and right cusp. The aortic valve is tricuspid. Aortic valve regurgitation is mild. No aortic stenosis is present. Aortic valve mean gradient measures 3.0 mmHg. Aortic valve peak gradient  measures 6.5 mmHg. Aortic valve area, by VTI measures 2.31 cm.  Pulmonic Valve: The pulmonic valve was normal in structure. Pulmonic valve regurgitation is mild. No evidence of pulmonic stenosis.  Aorta: The aortic root and ascending aorta are structurally normal, with no evidence of dilitation.  IAS/Shunts: The atrial septum is grossly normal.  Additional Comments: 3D was performed not requiring image post processing on an independent workstation and was normal.   LEFT VENTRICLE PLAX 2D LVIDd:         4.72 cm   Diastology LVIDs:         3.23 cm   LV e' medial:    3.60 cm/s LV PW:         0.91 cm   LV E/e' medial:  18.3 LV IVS:        0.74 cm   LV e' lateral:   8.22 cm/s LVOT diam:     1.90 cm   LV E/e' lateral: 8.0 LV SV:         57 LV SV Index:   29 LVOT Area:     2.84 cm  3D Volume EF: 3D EF:        52 % LV EDV:       83 ml LV ESV:       39 ml LV SV:        43 ml  RIGHT VENTRICLE RV S prime:     16.40 cm/s TAPSE (M-mode): 2.1 cm  LEFT ATRIUM             Index        RIGHT ATRIUM           Index LA diam:        4.70 cm 2.39 cm/m   RA Area:     19.70 cm LA Vol (A2C):   52.8 ml 26.83 ml/m  RA Volume:   62.90 ml  31.96 ml/m LA Vol (A4C):   37.0 ml 18.80 ml/m LA Biplane Vol: 44.0 ml 22.36 ml/m AORTIC VALVE                    PULMONIC VALVE AV Area (Vmax):    2.19 cm     PV Vmax:          0.99 m/s AV Area (Vmean):   2.32 cm     PV Peak grad:     3.9 mmHg AV Area (VTI):     2.31 cm     PR End Diast Vel: 4.67 msec AV Vmax:           127.00 cm/s AV Vmean:          84.900 cm/s AV VTI:            0.247 m AV Peak Grad:      6.5 mmHg AV Mean Grad:      3.0 mmHg LVOT Vmax:  98.20 cm/s LVOT Vmean:        69.400 cm/s LVOT VTI:          0.201 m LVOT/AV VTI ratio: 0.81  AORTA Ao Root diam: 3.30 cm Ao Asc diam:  3.30 cm Ao Arch diam: 3.5 cm  MITRAL VALVE               TRICUSPID VALVE MV Area (PHT): 3.79 cm    TR Peak grad:   22.8 mmHg MV Decel Time: 200  msec    TR Vmax:        239.00 cm/s MV E velocity: 65.85 cm/s MV A velocity: 75.20 cm/s  SHUNTS MV E/A ratio:  0.88        Systemic VTI:  0.20 m Systemic Diam: 1.90 cm  Stanly Leavens MD Electronically signed by Stanly Leavens MD Signature Date/Time: 06/28/2023/2:03:59 PM    Final    MONITORS  LONG TERM MONITOR (3-14 DAYS) 09/22/2021  Narrative 3 Day Zio Monitor  Quality: Fair.  Baseline artifact. Predominant rhythm: sinus rhythm Average heart rate: 74 bpm Max heart rate: 139 bpm Min heart rate: 51 bpm Pauses >2.5 seconds: none Occasional PACs (1.5%) Rare PVCs and ventricular couplets  Tiffany C. Raford, MD, [facility] 10/26/2021 8:19 AM       ______________________________________________________________________________________________         Risk Assessment/Calculations:             Physical Exam:   VS:  BP 114/74   Pulse 77   Ht 5' 8 (1.727 m)   Wt 180 lb (81.6 kg)   SpO2 97%   BMI 27.37 kg/m    Wt Readings from Last 3 Encounters:  12/10/23 180 lb (81.6 kg)  06/08/23 183 lb (83 kg)  03/26/23 181 lb 3.2 oz (82.2 kg)    GEN: Well nourished, well developed in no acute distress NECK: No JVD; No carotid bruits CARDIAC: RRR, no murmurs, rubs, gallops RESPIRATORY:  Clear to auscultation without rales, wheezing or rhonchi  ABDOMEN: Soft, non-tender, non-distended EXTREMITIES:  No edema; No deformity   ASSESSMENT AND PLAN: .    Episodic tachycardia - Symptoms quiescent. If more bothersome in future, consider PRN Metoprolol .  HTN - BP controlled in clinic and average home BP at goal <130/80. No indication for antihypertensive therapy at this time. Advised to monitor BP once per week and if persistently >130/80, let us  know.   Nonobstructive CAD / HLD, LDL goal < 70 - Stable with no anginal symptoms. Her present symptoms are more consistent with GI etiology, follows with Dr. Rosalie. No indication for ischemic evaluation.  GDMT atorvastatin  40mg   daily. Recommend aiming for 150 minutes of moderate intensity activity per week and following a heart healthy diet.  Update direct LDL, if not at goal <70 consider increased dose Atorvastatin .        Dispo: follow up in 9 months with Dr. Raford or APP  Signed, Reche GORMAN Finder, NP

## 2023-12-10 NOTE — Patient Instructions (Signed)
 Medication Instructions:  Continue your current medications  *If you need a refill on your cardiac medications before your next appointment, please call your pharmacy*  Lab Work: Your physician recommends that you return for lab work today: ALT, direct LDL  If you have labs (blood work) drawn today and your tests are completely normal, you will receive your results only by: MyChart Message (if you have MyChart) OR A paper copy in the mail If you have any lab test that is abnormal or we need to change your treatment, we will call you to review the results.  Follow-Up: At [facility], you and your health needs are our priority.  As part of our continuing mission to provide you with exceptional heart care, our providers are all part of one team.  This team includes your primary Cardiologist (physician) and Advanced Practice Providers or APPs (Physician Assistants and Nurse Practitioners) who all work together to provide you with the care you need, when you need it.  Your next appointment:   9 month(s)  Provider:   Annabella Scarce, MD, Rosaline Bane, NP, or Reche Finder, NP    We recommend signing up for the patient portal called MyChart.  Sign up information is provided on this After Visit Summary.  MyChart is used to connect with patients for Virtual Visits (Telemedicine).  Patients are able to view lab/test results, encounter notes, upcoming appointments, etc.  Non-urgent messages can be sent to your provider as well.   To learn more about what you can do with MyChart, go to [email or web address].   Other Instructions  Heart Healthy Diet Recommendations: A low-salt diet is recommended. Meats should be grilled, baked, or boiled. Avoid fried foods. Focus on lean protein sources like fish or chicken with vegetables and fruits. The American Heart Association is a Chief Technology Officer!  American Heart Association Diet and Lifeystyle Recommendations    Exercise  recommendations: The American Heart Association recommends 150 minutes of moderate intensity exercise weekly. Try 30 minutes of moderate intensity exercise 4-5 times per week. This could include walking, jogging, or swimming.

## 2023-12-12 ENCOUNTER — Ambulatory Visit (HOSPITAL_BASED_OUTPATIENT_CLINIC_OR_DEPARTMENT_OTHER): Payer: Self-pay | Admitting: Family

## 2023-12-12 ENCOUNTER — Encounter (HOSPITAL_BASED_OUTPATIENT_CLINIC_OR_DEPARTMENT_OTHER): Payer: Self-pay

## 2023-12-12 DIAGNOSIS — I2510 Atherosclerotic heart disease of native coronary artery without angina pectoris: Secondary | ICD-10-CM

## 2023-12-12 DIAGNOSIS — Z79899 Other long term (current) drug therapy: Secondary | ICD-10-CM

## 2023-12-12 DIAGNOSIS — I25118 Atherosclerotic heart disease of native coronary artery with other forms of angina pectoris: Secondary | ICD-10-CM

## 2023-12-12 DIAGNOSIS — E785 Hyperlipidemia, unspecified: Secondary | ICD-10-CM

## 2023-12-12 LAB — LDL CHOLESTEROL, DIRECT: LDL Direct: 96 mg/dL (ref 0–99)

## 2023-12-12 LAB — ALT: ALT: 18 IU/L (ref 0–32)

## 2023-12-12 MED ORDER — ATORVASTATIN CALCIUM 80 MG PO TABS: 80.0000 mg | ORAL_TABLET | Freq: Every day | ORAL | 2 refills | Status: AC

## 2023-12-12 NOTE — Telephone Encounter (Signed)
 The patient has been notified of the result and verbalized understanding.  All questions (if any) were answered.  Pt aware to increase her atorvastatin  to 80 mg po daily and come in for repeat lipids/ALT in 2-3 months.   Confirmed the pharmacy of choice with the pt.   Pt aware to write down on her calendar to return for the lab work anywhere from 12/15 to 03/13/24.  She is aware to come fasting to this lab appointment.   Pt verbalized understanding and agrees with this plan.

## 2023-12-12 NOTE — Telephone Encounter (Signed)
-----   Message from Reche GORMAN Finder sent at 12/12/2023  9:07 AM EDT ----- Normal liver enzyme. Direct LDL (bad cholesterol) not at goal <70. Recommend increase Atorvastatin  to 80mg  daily with repeat fasting lipid panel, ALT in 2-3 months.  ----- Message ----- From: Interface, [facility] Lab Results In Sent: 12/12/2023   3:36 AM EDT To: Reche GORMAN Finder, NP
